# Patient Record
Sex: Female | Born: 1990 | Hispanic: No | Marital: Married | Smoking: Never smoker
Health system: Southern US, Community
[De-identification: ages and names within clinical notes are randomized; demographics above are authoritative.]

## PROBLEM LIST (undated history)

## (undated) DIAGNOSIS — R51 Headache: Secondary | ICD-10-CM

## (undated) DIAGNOSIS — Z8619 Personal history of other infectious and parasitic diseases: Secondary | ICD-10-CM

## (undated) HISTORY — PX: OTHER SURGICAL HISTORY: SHX169

## (undated) HISTORY — DX: Headache: R51

## (undated) HISTORY — DX: Personal history of other infectious and parasitic diseases: Z86.19

---

## 2012-11-11 NOTE — L&D Delivery Note (Signed)
Delivery Note At 5:23 PM a viable and healthy female was delivered via  (Presentation: OA; LOT ).  APGAR: 8,9 ; weight P .   Placenta status: delivered intact, .  Cord:  3 VC with the following complications: nuchal .    Anesthesia: IV pain meds  Episiotomy: none Lacerations: none Suture Repair: N/A Est. Blood Loss (mL): 400cc  Mom to postpartum.  Baby to stay with momm and dad.  BOVARD,Amanda Mueller 07/08/2013, 5:33 PM  Br/Bo/ B+/ RI/Contra?

## 2013-02-25 LAB — OB RESULTS CONSOLE ANTIBODY SCREEN: Antibody Screen: NEGATIVE

## 2013-02-25 LAB — OB RESULTS CONSOLE GC/CHLAMYDIA: Gonorrhea: NEGATIVE

## 2013-02-25 LAB — OB RESULTS CONSOLE ABO/RH

## 2013-06-09 LAB — OB RESULTS CONSOLE GBS: GBS: NEGATIVE

## 2013-06-09 LAB — OB RESULTS CONSOLE GC/CHLAMYDIA
Chlamydia: NEGATIVE
Gonorrhea: NEGATIVE

## 2013-07-07 ENCOUNTER — Telehealth (HOSPITAL_COMMUNITY): Payer: Self-pay | Admitting: *Deleted

## 2013-07-07 ENCOUNTER — Encounter (HOSPITAL_COMMUNITY): Payer: Self-pay | Admitting: *Deleted

## 2013-07-07 DIAGNOSIS — O0930 Supervision of pregnancy with insufficient antenatal care, unspecified trimester: Secondary | ICD-10-CM

## 2013-07-07 NOTE — H&P (Signed)
Amanda Mueller is a 22 y.o. female G1P0 at 12+ for iol given post-dates and favorable cervix.  +FM, no LOF, no VB, occ ctx; Pregnancy complicated by late entry to care and HA,  D/w pt IOL, POC and longer time to delivery  Maternal Medical History:  Contractions: Frequency: irregular.    Fetal activity: Perceived fetal activity is normal.    Prenatal complications: no prenatal complications Prenatal Complications - Diabetes: none.    OB History   Grav Para Term Preterm Abortions TAB SAB Ect Mult Living   1             G1 present No abn pap, no STDs  Past Medical History  Diagnosis Date  . Hx of varicella   . ZOXWRUEA(540.9)    Past Surgical History  Procedure Laterality Date  . Abdominal tumor removal      3 mos age   Family History: cervical CA - mgm Social History: denies ETOH, tob, drugs; single Meds PNV All NKDA   Prenatal Transfer Tool  Maternal Diabetes: No Genetic Screening: Normal Maternal Ultrasounds/Referrals: Normal Fetal Ultrasounds or other Referrals:  None Maternal Substance Abuse:  No Significant Maternal Medications:  None Significant Maternal Lab Results:  Lab values include: Group B Strep negative Other Comments:  late entry to care about 20wk  Review of Systems  Constitutional: Negative.   HENT: Negative.   Eyes: Negative.   Respiratory: Negative.   Cardiovascular: Negative.   Gastrointestinal: Negative.   Genitourinary: Negative.   Musculoskeletal: Negative.   Skin: Negative.   Neurological: Negative.   Psychiatric/Behavioral: Negative.       Last menstrual period 09/28/2012. Maternal Exam:  Uterine Assessment: Contraction frequency is irregular.   Abdomen: Fundal height is appropriate for gestation.   Estimated fetal weight is 7-8#.   Fetal presentation: vertex  Introitus: Normal vulva. Normal vagina.  Pelvis: adequate for delivery.   Cervix: Cervix evaluated by digital exam.     Physical Exam  Constitutional: She is  oriented to person, place, and time. She appears well-developed and well-nourished.  HENT:  Head: Normocephalic and atraumatic.  Cardiovascular: Normal rate and regular rhythm.   Respiratory: Effort normal and breath sounds normal. No respiratory distress.  GI: Soft. Bowel sounds are normal. There is no tenderness.  Musculoskeletal: Normal range of motion.  Neurological: She is alert and oriented to person, place, and time.  Skin: Skin is warm and dry.  Psychiatric: She has a normal mood and affect. Her behavior is normal.    Prenatal labs: ABO, Rh: B/Positive/-- (04/17 0000) Antibody: Negative (04/17 0000) Rubella: Immune (04/17 0000) RPR: Nonreactive (04/17 0000)  HBsAg: Negative (04/17 0000)  HIV: Non-reactive (04/17 0000)  GBS: Negative (07/30 0000)   Tdap 6/4 Hgb 12.5/Plt 255K/ GC neg/ Chl neg/ Ur Cx + E coli/AFP WNL  [place] cwd - 18wk, EDC 07/05/13 - limited anat 23 wk completes anatomy, post plac, female  Assessment/Plan: 22yo G1P0 at 79+ for IOL given term status and favorable cervix AROM and pitocin for IOL Epidural/stadol prn  Expect SVD  Amanda Mueller,Amanda Mueller 07/07/2013, 9:58 PM

## 2013-07-07 NOTE — Telephone Encounter (Signed)
Preadmission screen  

## 2013-07-08 ENCOUNTER — Inpatient Hospital Stay (HOSPITAL_COMMUNITY)
Admission: RE | Admit: 2013-07-08 | Discharge: 2013-07-08 | Disposition: A | Discharge status: Home / Self Care | Payer: Medicaid Other | Source: Ambulatory Visit | Attending: Obstetrics and Gynecology | Admitting: Obstetrics and Gynecology

## 2013-07-08 ENCOUNTER — Inpatient Hospital Stay (HOSPITAL_COMMUNITY)
Admission: AD | Admit: 2013-07-08 | Discharge: 2013-07-10 | DRG: 775 | Disposition: A | Discharge status: Home / Self Care | Payer: Medicaid Other | Source: Ambulatory Visit | Attending: Obstetrics and Gynecology | Admitting: Obstetrics and Gynecology

## 2013-07-08 ENCOUNTER — Encounter (HOSPITAL_COMMUNITY): Payer: Self-pay | Admitting: *Deleted

## 2013-07-08 DIAGNOSIS — O0932 Supervision of pregnancy with insufficient antenatal care, second trimester: Secondary | ICD-10-CM

## 2013-07-08 DIAGNOSIS — O0930 Supervision of pregnancy with insufficient antenatal care, unspecified trimester: Secondary | ICD-10-CM

## 2013-07-08 LAB — TYPE AND SCREEN
ABO/RH(D): B POS
Antibody Screen: NEGATIVE

## 2013-07-08 LAB — ABO/RH: ABO/RH(D): B POS

## 2013-07-08 LAB — CBC
HCT: 35.6 % — ABNORMAL LOW (ref 36.0–46.0)
Hemoglobin: 12.3 g/dL (ref 12.0–15.0)
RDW: 13.2 % (ref 11.5–15.5)
WBC: 13.3 10*3/uL — ABNORMAL HIGH (ref 4.0–10.5)

## 2013-07-08 MED ORDER — TETANUS-DIPHTH-ACELL PERTUSSIS 5-2.5-18.5 LF-MCG/0.5 IM SUSP: 0.5000 mL | Freq: Once | INTRAMUSCULAR | Status: DC

## 2013-07-08 MED ORDER — NALOXONE HCL 0.4 MG/ML IJ SOLN
INTRAMUSCULAR | Status: AC
  Filled 2013-07-08: qty 1

## 2013-07-08 MED ORDER — LIDOCAINE HCL (PF) 1 % IJ SOLN
30.0000 mL | INTRAMUSCULAR | Status: DC | PRN
  Filled 2013-07-08: qty 30

## 2013-07-08 MED ORDER — TERBUTALINE SULFATE 1 MG/ML IJ SOLN: 0.2500 mg | Freq: Once | INTRAMUSCULAR | Status: DC | PRN

## 2013-07-08 MED ORDER — OXYTOCIN BOLUS FROM INFUSION: 500.0000 mL | INTRAVENOUS | Status: DC

## 2013-07-08 MED ORDER — ACETAMINOPHEN 325 MG PO TABS: 650.0000 mg | ORAL_TABLET | ORAL | Status: DC | PRN

## 2013-07-08 MED ORDER — OXYTOCIN 40 UNITS IN LACTATED RINGERS INFUSION - SIMPLE MED
62.5000 mL/h | INTRAVENOUS | Status: DC
  Administered 2013-07-08: 999 mL/h via INTRAVENOUS
  Administered 2013-07-08: 62.5 mL/h via INTRAVENOUS

## 2013-07-08 MED ORDER — ZOLPIDEM TARTRATE 5 MG PO TABS: 5.0000 mg | ORAL_TABLET | Freq: Every evening | ORAL | Status: DC | PRN

## 2013-07-08 MED ORDER — LACTATED RINGERS IV SOLN
INTRAVENOUS | Status: DC
  Administered 2013-07-08: 07:00:00 via INTRAVENOUS

## 2013-07-08 MED ORDER — IBUPROFEN 600 MG PO TABS
600.0000 mg | ORAL_TABLET | Freq: Four times a day (QID) | ORAL | Status: DC
  Administered 2013-07-08 – 2013-07-10 (×6): 600 mg via ORAL
  Filled 2013-07-08 (×6): qty 1

## 2013-07-08 MED ORDER — SENNOSIDES-DOCUSATE SODIUM 8.6-50 MG PO TABS
2.0000 | ORAL_TABLET | Freq: Every day | ORAL | Status: DC
  Administered 2013-07-08 – 2013-07-09 (×2): 2 via ORAL

## 2013-07-08 MED ORDER — BENZOCAINE-MENTHOL 20-0.5 % EX AERO: 1.0000 "application " | INHALATION_SPRAY | CUTANEOUS | Status: DC | PRN

## 2013-07-08 MED ORDER — FENTANYL CITRATE 0.05 MG/ML IJ SOLN
INTRAMUSCULAR | Status: AC
  Administered 2013-07-08: 100 ug via INTRAVENOUS
  Filled 2013-07-08: qty 2

## 2013-07-08 MED ORDER — DIBUCAINE 1 % RE OINT: 1.0000 "application " | TOPICAL_OINTMENT | RECTAL | Status: DC | PRN

## 2013-07-08 MED ORDER — ONDANSETRON HCL 4 MG PO TABS: 4.0000 mg | ORAL_TABLET | ORAL | Status: DC | PRN

## 2013-07-08 MED ORDER — DIPHENHYDRAMINE HCL 25 MG PO CAPS: 25.0000 mg | ORAL_CAPSULE | Freq: Four times a day (QID) | ORAL | Status: DC | PRN

## 2013-07-08 MED ORDER — ONDANSETRON HCL 4 MG/2ML IJ SOLN: 4.0000 mg | Freq: Four times a day (QID) | INTRAMUSCULAR | Status: DC | PRN

## 2013-07-08 MED ORDER — PRENATAL MULTIVITAMIN CH
1.0000 | ORAL_TABLET | Freq: Every day | ORAL | Status: DC
  Administered 2013-07-09: 1 via ORAL
  Filled 2013-07-08: qty 1

## 2013-07-08 MED ORDER — LIDOCAINE HCL (PF) 1 % IJ SOLN
INTRAMUSCULAR | Status: AC
  Filled 2013-07-08: qty 30

## 2013-07-08 MED ORDER — OXYTOCIN 40 UNITS IN LACTATED RINGERS INFUSION - SIMPLE MED
1.0000 m[IU]/min | INTRAVENOUS | Status: DC
  Administered 2013-07-08: 2 m[IU]/min via INTRAVENOUS
  Administered 2013-07-08: 4 m[IU]/min via INTRAVENOUS
  Filled 2013-07-08: qty 1000

## 2013-07-08 MED ORDER — LANOLIN HYDROUS EX OINT: TOPICAL_OINTMENT | CUTANEOUS | Status: DC | PRN

## 2013-07-08 MED ORDER — BUTORPHANOL TARTRATE 1 MG/ML IJ SOLN
2.0000 mg | INTRAMUSCULAR | Status: DC | PRN
  Administered 2013-07-08 (×2): 2 mg via INTRAVENOUS
  Filled 2013-07-08 (×3): qty 2

## 2013-07-08 MED ORDER — WITCH HAZEL-GLYCERIN EX PADS: 1.0000 "application " | MEDICATED_PAD | CUTANEOUS | Status: DC | PRN

## 2013-07-08 MED ORDER — OXYCODONE-ACETAMINOPHEN 5-325 MG PO TABS: 1.0000 | ORAL_TABLET | ORAL | Status: DC | PRN

## 2013-07-08 MED ORDER — IBUPROFEN 600 MG PO TABS
600.0000 mg | ORAL_TABLET | Freq: Four times a day (QID) | ORAL | Status: DC
  Administered 2013-07-08: 600 mg via ORAL
  Filled 2013-07-08: qty 1

## 2013-07-08 MED ORDER — SIMETHICONE 80 MG PO CHEW: 80.0000 mg | CHEWABLE_TABLET | ORAL | Status: DC | PRN

## 2013-07-08 MED ORDER — CITRIC ACID-SODIUM CITRATE 334-500 MG/5ML PO SOLN: 30.0000 mL | ORAL | Status: DC | PRN

## 2013-07-08 MED ORDER — LACTATED RINGERS IV SOLN
500.0000 mL | INTRAVENOUS | Status: DC | PRN
  Administered 2013-07-08: 500 mL via INTRAVENOUS

## 2013-07-08 MED ORDER — IBUPROFEN 600 MG PO TABS: 600.0000 mg | ORAL_TABLET | Freq: Four times a day (QID) | ORAL | Status: DC | PRN

## 2013-07-08 MED ORDER — FENTANYL CITRATE 0.05 MG/ML IJ SOLN
100.0000 ug | Freq: Once | INTRAMUSCULAR | Status: AC
  Administered 2013-07-08: 100 ug via INTRAVENOUS

## 2013-07-08 MED ORDER — LACTATED RINGERS IV SOLN: INTRAVENOUS | Status: DC

## 2013-07-08 MED ORDER — ONDANSETRON HCL 4 MG/2ML IJ SOLN: 4.0000 mg | INTRAMUSCULAR | Status: DC | PRN

## 2013-07-08 NOTE — Plan of Care (Signed)
I have review nursing documentation and concur with assessments

## 2013-07-08 NOTE — Progress Notes (Signed)
Patient ID: Amanda Mueller, female   DOB: Sep 01, 1991, 22 y.o.   MRN: [id number]  uncomf with ctx.  Came in over night in early labor  AFVSS gen NAD FHTs 120's category 1 toco q 2 -  SVE 4.5/90/0  Expect SVD Continue current mgmt Epidural vs IV pain meds prn;  IOL with AROM/pitocin prn

## 2013-07-08 NOTE — Progress Notes (Signed)
Patient ID: Amanda Mueller, female   DOB: [birth year], 22 y.o.   MRN: [id number]  Uncomfortable with ctx.  AFVSS gen NAD FHTs  120's mod var, category 1 toco q 4 min, irr  SVE 5/90/0 AROM ?forebag for light meconium, d/w pt - will monitor  IUPC placed without diff/comp.  22yo G1 at 40+ in labor Augment with pitocin prn Epidural or IV pain meds prn - pt declines epidural Expect SVD

## 2013-07-08 NOTE — MAU Note (Signed)
Contractions,  

## 2013-07-08 NOTE — Progress Notes (Signed)
Patient ID: Amanda Mueller, female   DOB: 07/15/1991, 22 y.o.   MRN: [id number]   Pt uncomfortable with ctx, has had doses of Stadol, declines epidural  AFVSS gen uncomf FHTs 120's min/mod variability toco Q  mVU getting adequate, pit at 4 mU SVE 6.5 per RN  Expect SVD

## 2013-07-08 NOTE — Progress Notes (Signed)
Patient ID: Amanda Mueller, female   DOB: 1991-09-04, 22 y.o.   MRN: [id number]  Pt complete and pushing  130's min/mod variability; some variable/early decels q 2-4 min  Expect SVD

## 2013-07-09 ENCOUNTER — Encounter (HOSPITAL_COMMUNITY): Payer: Self-pay | Admitting: *Deleted

## 2013-07-09 LAB — CBC
MCV: 84.5 fL (ref 78.0–100.0)
Platelets: 198 10*3/uL (ref 150–400)
RBC: 3.54 MIL/uL — ABNORMAL LOW (ref 3.87–5.11)
WBC: 15.6 10*3/uL — ABNORMAL HIGH (ref 4.0–10.5)

## 2013-07-09 NOTE — Discharge Summary (Signed)
Obstetric Discharge Summary Reason for Admission: induction of labor Prenatal Procedures: none Intrapartum Procedures: spontaneous vaginal delivery Postpartum Procedures: none Complications-Operative and Postpartum: none Hemoglobin  Date Value Range Status  07/09/2013 10.1* 12.0 - 15.0 g/dL Final     REPEATED TO VERIFY     DELTA CHECK NOTED     HCT  Date Value Range Status  07/09/2013 29.9* 36.0 - 46.0 % Final    Physical Exam:  General: alert and cooperative Lochia: appropriate Uterine Fundus: firm   Discharge Diagnoses: Term Pregnancy-delivered  Discharge Information: Date: 07/10/2013 Activity: pelvic rest Diet: routine Medications: Ibuprofen Condition: improved Instructions: refer to practice specific booklet Discharge to: home Follow-up Information   Follow up with BOVARD,JODY, MD. Schedule an appointment as soon as possible for a visit in 6 weeks.   Specialty:  Obstetrics and Gynecology   Contact information:   [address] [place] [address] [phone]       Newborn Data: Live born female  Birth Weight: 6 lb 2.2 oz (2785 g) APGAR: 9, 9  Home with mother.  Amanda Mueller 07/10/2013, 7:16 AM

## 2013-07-09 NOTE — Progress Notes (Signed)
Post Partum Day 1 Subjective: no complaints, up ad lib, voiding, tolerating PO and nl lochia, pain controlled  Objective: Blood pressure 104/65, pulse 79, temperature 97.7 F (36.5 C), temperature source Oral, resp. rate 19, height 5\' 3"  (1.6 m), weight 87.091 kg (192 lb), last menstrual period 09/28/2012, SpO2 98.00%, unknown if currently breastfeeding.  Physical Exam:  General: alert and no distress Lochia: appropriate Uterine Fundus: firm   Recent Labs  07/08/13 0455 07/09/13 0615  HGB 12.3 10.1*  HCT 35.6* 29.9*    Assessment/Plan: Plan for discharge tomorrow, Breastfeeding and Lactation consult.  Routine PP care.     LOS: 1 day   BOVARD,Roselynn Whitacre 07/09/2013, 8:32 AM

## 2013-07-10 MED ORDER — IBUPROFEN 600 MG PO TABS: 600.0000 mg | ORAL_TABLET | Freq: Four times a day (QID) | ORAL | Status: DC

## 2013-07-10 NOTE — Progress Notes (Signed)
Post Partum Day 2 Subjective: no complaints, up ad lib and tolerating PO  Objective: Blood pressure 104/65, pulse 79, temperature 97.9 F (36.6 C), temperature source Oral, resp. rate 18, height 5\' 3"  (1.6 m), weight 87.091 kg (192 lb), last menstrual period 09/28/2012, SpO2 98.00%, unknown if currently breastfeeding.  Physical Exam:  General: alert and cooperative Lochia: appropriate Uterine Fundus: firm   Recent Labs  07/08/13 0455 07/09/13 0615  HGB 12.3 10.1*  HCT 35.6* 29.9*    Assessment/Plan: Discharge home Return to office in 6 weeks   LOS: 2 days   Fabyan Loughmiller W 07/10/2013, 7:14 AM

## 2014-09-12 ENCOUNTER — Encounter (HOSPITAL_COMMUNITY): Payer: Self-pay | Admitting: *Deleted

## 2015-11-12 NOTE — L&D Delivery Note (Signed)
Delivery Note At 2:18 PM a viable and healthy female was delivered via Vaginal, Spontaneous Delivery (Presentation: OA;ROT ).  APGAR: 8, 9; weight P  .   Placenta status: delivered, intact .  Cord: 3VC with the following complications: none.    Anesthesia:  Nitrous oxide Episiotomy: None Lacerations: None Suture Repair: none Est. Blood Loss (mL): 200cc  Mom to postpartum.  Baby to Couplet care / Skin to Skin.  Bovard-Stuckert, Amanda Mueller 10/07/2016, 2:36 PM  [facility]  D/W pt circumcision for female infant inc r/b/a - and process, wish to proceed.  Will call to schedule

## 2016-05-23 LAB — OB RESULTS CONSOLE HIV ANTIBODY (ROUTINE TESTING): HIV: NONREACTIVE

## 2016-05-23 LAB — OB RESULTS CONSOLE GC/CHLAMYDIA
Chlamydia: NEGATIVE
GC PROBE AMP, GENITAL: NEGATIVE

## 2016-05-23 LAB — OB RESULTS CONSOLE RPR: RPR: NONREACTIVE

## 2016-06-28 ENCOUNTER — Inpatient Hospital Stay (HOSPITAL_COMMUNITY)
Admission: AD | Admit: 2016-06-28 | Payer: Medicaid Other | Source: Ambulatory Visit | Admitting: Obstetrics and Gynecology

## 2016-09-23 LAB — OB RESULTS CONSOLE GBS
GBS: POSITIVE
STREP GROUP B AG: NEGATIVE

## 2016-10-07 ENCOUNTER — Inpatient Hospital Stay (HOSPITAL_COMMUNITY)
Admission: AD | Admit: 2016-10-07 | Discharge: 2016-10-09 | DRG: 775 | Disposition: A | Discharge status: Home / Self Care | Payer: Medicaid Other | Source: Ambulatory Visit | Attending: Obstetrics and Gynecology | Admitting: Obstetrics and Gynecology

## 2016-10-07 ENCOUNTER — Encounter (HOSPITAL_COMMUNITY): Payer: Self-pay | Admitting: *Deleted

## 2016-10-07 DIAGNOSIS — Z3493 Encounter for supervision of normal pregnancy, unspecified, third trimester: Secondary | ICD-10-CM | POA: Diagnosis present

## 2016-10-07 DIAGNOSIS — O99824 Streptococcus B carrier state complicating childbirth: Secondary | ICD-10-CM | POA: Diagnosis present

## 2016-10-07 DIAGNOSIS — Z3A39 39 weeks gestation of pregnancy: Secondary | ICD-10-CM | POA: Diagnosis not present

## 2016-10-07 LAB — CBC
HCT: 37.3 % (ref 36.0–46.0)
Hemoglobin: 13 g/dL (ref 12.0–15.0)
MCH: 29.4 pg (ref 26.0–34.0)
MCHC: 34.9 g/dL (ref 30.0–36.0)
MCV: 84.4 fL (ref 78.0–100.0)
PLATELETS: 183 10*3/uL (ref 150–400)
RBC: 4.42 MIL/uL (ref 3.87–5.11)
RDW: 13.3 % (ref 11.5–15.5)
WBC: 10.5 10*3/uL (ref 4.0–10.5)

## 2016-10-07 LAB — URINALYSIS, ROUTINE W REFLEX MICROSCOPIC
BILIRUBIN URINE: NEGATIVE
GLUCOSE, UA: NEGATIVE mg/dL
HGB URINE DIPSTICK: NEGATIVE
KETONES UR: NEGATIVE mg/dL
Leukocytes, UA: NEGATIVE
NITRITE: NEGATIVE
PH: 6.5 (ref 5.0–8.0)
Protein, ur: NEGATIVE mg/dL
Specific Gravity, Urine: 1.025 (ref 1.005–1.030)

## 2016-10-07 LAB — POCT FERN TEST

## 2016-10-07 LAB — TYPE AND SCREEN
ABO/RH(D): B POS
Antibody Screen: NEGATIVE

## 2016-10-07 MED ORDER — OXYTOCIN 40 UNITS IN LACTATED RINGERS INFUSION - SIMPLE MED
2.5000 [IU]/h | INTRAVENOUS | Status: DC
  Filled 2016-10-07: qty 1000

## 2016-10-07 MED ORDER — SENNOSIDES-DOCUSATE SODIUM 8.6-50 MG PO TABS
2.0000 | ORAL_TABLET | ORAL | Status: DC
  Administered 2016-10-08 (×2): 2 via ORAL
  Filled 2016-10-07 (×2): qty 2

## 2016-10-07 MED ORDER — OXYTOCIN BOLUS FROM INFUSION
500.0000 mL | Freq: Once | INTRAVENOUS | Status: AC
  Administered 2016-10-07: 500 mL via INTRAVENOUS

## 2016-10-07 MED ORDER — PENICILLIN G POTASSIUM 5000000 UNITS IJ SOLR
5.0000 10*6.[IU] | Freq: Once | INTRAVENOUS | Status: DC
  Filled 2016-10-07: qty 5

## 2016-10-07 MED ORDER — IBUPROFEN 600 MG PO TABS
600.0000 mg | ORAL_TABLET | Freq: Four times a day (QID) | ORAL | Status: DC
  Administered 2016-10-07 – 2016-10-09 (×8): 600 mg via ORAL
  Filled 2016-10-07 (×7): qty 1

## 2016-10-07 MED ORDER — SIMETHICONE 80 MG PO CHEW
80.0000 mg | CHEWABLE_TABLET | ORAL | Status: DC | PRN
Start: 2016-10-07 — End: 2016-10-09

## 2016-10-07 MED ORDER — ONDANSETRON HCL 4 MG/2ML IJ SOLN: 4.0000 mg | Freq: Four times a day (QID) | INTRAMUSCULAR | Status: DC | PRN

## 2016-10-07 MED ORDER — PENICILLIN G POT IN DEXTROSE 60000 UNIT/ML IV SOLN
3.0000 10*6.[IU] | INTRAVENOUS | Status: DC
  Filled 2016-10-07 (×5): qty 50

## 2016-10-07 MED ORDER — LACTATED RINGERS IV SOLN: 500.0000 mL | INTRAVENOUS | Status: DC | PRN

## 2016-10-07 MED ORDER — SODIUM CHLORIDE 0.9 % IV SOLN
2.0000 g | Freq: Once | INTRAVENOUS | Status: AC
  Administered 2016-10-07: 2 g via INTRAVENOUS
  Filled 2016-10-07: qty 2000

## 2016-10-07 MED ORDER — COCONUT OIL OIL: 1.0000 "application " | TOPICAL_OIL | Status: DC | PRN

## 2016-10-07 MED ORDER — OXYCODONE HCL 5 MG PO TABS: 10.0000 mg | ORAL_TABLET | ORAL | Status: DC | PRN

## 2016-10-07 MED ORDER — ONDANSETRON HCL 4 MG PO TABS: 4.0000 mg | ORAL_TABLET | ORAL | Status: DC | PRN

## 2016-10-07 MED ORDER — WITCH HAZEL-GLYCERIN EX PADS: 1.0000 "application " | MEDICATED_PAD | CUTANEOUS | Status: DC | PRN

## 2016-10-07 MED ORDER — ACETAMINOPHEN 325 MG PO TABS: 650.0000 mg | ORAL_TABLET | ORAL | Status: DC | PRN

## 2016-10-07 MED ORDER — FLEET ENEMA 7-19 GM/118ML RE ENEM: 1.0000 | ENEMA | RECTAL | Status: DC | PRN

## 2016-10-07 MED ORDER — OXYCODONE HCL 5 MG PO TABS: 5.0000 mg | ORAL_TABLET | ORAL | Status: DC | PRN

## 2016-10-07 MED ORDER — ONDANSETRON HCL 4 MG/2ML IJ SOLN: 4.0000 mg | INTRAMUSCULAR | Status: DC | PRN

## 2016-10-07 MED ORDER — LACTATED RINGERS IV SOLN: INTRAVENOUS | Status: DC

## 2016-10-07 MED ORDER — OXYCODONE-ACETAMINOPHEN 5-325 MG PO TABS: 1.0000 | ORAL_TABLET | ORAL | Status: DC | PRN

## 2016-10-07 MED ORDER — PRENATAL MULTIVITAMIN CH
1.0000 | ORAL_TABLET | Freq: Every day | ORAL | Status: DC
  Administered 2016-10-08 – 2016-10-09 (×2): 1 via ORAL
  Filled 2016-10-07: qty 1

## 2016-10-07 MED ORDER — DIBUCAINE 1 % RE OINT: 1.0000 "application " | TOPICAL_OINTMENT | RECTAL | Status: DC | PRN

## 2016-10-07 MED ORDER — SOD CITRATE-CITRIC ACID 500-334 MG/5ML PO SOLN: 30.0000 mL | ORAL | Status: DC | PRN

## 2016-10-07 MED ORDER — DIPHENHYDRAMINE HCL 25 MG PO CAPS: 25.0000 mg | ORAL_CAPSULE | Freq: Four times a day (QID) | ORAL | Status: DC | PRN

## 2016-10-07 MED ORDER — ZOLPIDEM TARTRATE 5 MG PO TABS: 5.0000 mg | ORAL_TABLET | Freq: Every evening | ORAL | Status: DC | PRN

## 2016-10-07 MED ORDER — BENZOCAINE-MENTHOL 20-0.5 % EX AERO: 1.0000 "application " | INHALATION_SPRAY | CUTANEOUS | Status: DC | PRN

## 2016-10-07 MED ORDER — LIDOCAINE HCL (PF) 1 % IJ SOLN
30.0000 mL | INTRAMUSCULAR | Status: DC | PRN
  Filled 2016-10-07: qty 30

## 2016-10-07 MED ORDER — OXYCODONE-ACETAMINOPHEN 5-325 MG PO TABS: 2.0000 | ORAL_TABLET | ORAL | Status: DC | PRN

## 2016-10-07 NOTE — H&P (Signed)
Amanda DuhamelMadeleine Mueller is a 25 y.o. female G2P1001 at 39+ presents with ctx, and cervical change.  Late presentation for prenatal care, around 20 wk.  Normal AFP.  Failed glucola but normal 3 hr GTT. Otherwise uncomplicated prenatal care, + GBBS.   OB History    Gravida Para Term Preterm AB Living   2 1 1     1    SAB TAB Ectopic Multiple Live Births           1    no abn pap No STD  G1 40wk SVD 6#2, female 2014 G2 present   Past Medical History:  Diagnosis Date  . Headache(784.0)   . Hx of varicella    Past Surgical History:  Procedure Laterality Date  . abdominal tumor removal     3 mos age   Family History: family history is not on file. Social History:  reports that she has never smoked. She has never used smokeless tobacco. She reports that she does not drink alcohol or use drugs. meds PNV All NKDA    Maternal Diabetes: No Genetic Screening: Normal Maternal Ultrasounds/Referrals: Normal Fetal Ultrasounds or other Referrals:  None Maternal Substance Abuse:  No Significant Maternal Medications:  None Significant Maternal Lab Results:  Lab values include: Group B Strep positive Other Comments:  None  Review of Systems  Constitutional: Negative.   HENT: Negative.   Eyes: Negative.   Respiratory: Negative.   Cardiovascular: Negative.   Gastrointestinal: Negative.   Genitourinary: Negative.   Musculoskeletal: Positive for back pain.  Skin: Negative.   Neurological: Negative.   Psychiatric/Behavioral: Negative.    Maternal Medical History:  Reason for admission: Contractions.   Contractions: Onset was 6-12 hours ago.   Frequency: regular.   Perceived severity is strong.    Fetal activity: Perceived fetal activity is normal.    Prenatal Complications - Diabetes: none.    Dilation: 6 Effacement (%): 90 Station: 0 Exam by:: Dr Ellyn HackBovard Blood pressure 129/77, pulse 87, temperature 98.1 F (36.7 C), temperature source Oral, resp. rate 18, height 5\' 3"  (1.6  m), weight 92.1 kg (203 lb), last menstrual period 01/05/2016, unknown if currently breastfeeding. Maternal Exam:  Uterine Assessment: Contraction strength is moderate.  Contraction frequency is regular.   Abdomen: Patient reports generalized tenderness.  Fundal height is appropriate for gestation.   Estimated fetal weight is 7-7.5#.   Fetal presentation: vertex  Introitus: Normal vulva. Normal vagina.  Pelvis: adequate for delivery.      Physical Exam  Constitutional: She is oriented to person, place, and time. She appears well-developed and well-nourished.  HENT:  Head: Normocephalic and atraumatic.  Cardiovascular: Normal rate and regular rhythm.   Respiratory: Effort normal and breath sounds normal. No respiratory distress. She has no wheezes.  GI: Soft. Bowel sounds are normal. She exhibits no distension. There is generalized tenderness.  Musculoskeletal: Normal range of motion.  Neurological: She is alert and oriented to person, place, and time.  Skin: Skin is warm and dry.  Psychiatric: She has a normal mood and affect. Her behavior is normal.    Prenatal labs: ABO, Rh: --/--/B POS (11/27 1102) Antibody: NEG (11/27 1102) Rubella:  immune RPR: Nonreactive (07/13 0000)  HBsAg:   neg HIV: Non-reactive (07/13 0000)  GBS: Positive (11/13 1316)   Hgb 12.2/Plt 236/Ur Cx neg/GC neg/ Chl neg, Pap WNL/AFP WNL/glucola 148 - nl 3hr GTT, essential panel negative  US nl anat, ant plac, female  Tdap/Flu 07/22/16  Assessment/Plan: 25yo G2P1001 at 39+ in  active labor SROM had after SVE Amp/PCN for GBBS prophylaxis Trying to do w/o anesthesia - epidural and nitrous written for Expect SVD   Bovard-Stuckert, Sarah Zerby 10/07/2016, 1:09 PM

## 2016-10-07 NOTE — MAU Note (Signed)
C/o ucs since 0200 this AM; ?SROM @ 0630:

## 2016-10-07 NOTE — Progress Notes (Signed)
Patient ID: Jetty DuhamelMadeleine Mueller, female   DOB: 07-Apr-1991, 25 y.o.   MRN: [id number]   Uncomfortable with ctx  AFVSS gen NAD FHTs 120-130's, good var, category 1 toco Q 2-5 min  SVE 6/70/0  SROM shortly after SVE - clear fluid

## 2016-10-07 NOTE — Anesthesia Pain Management Evaluation Note (Signed)
  CRNA Pain Management Visit Note  Patient: Amanda DuhamelMadeleine Mueller, 25 y.o., female  "Hello I am a [facility] of the anesthesia team at [facility]. We have an anesthesia team available at all times to provide care throughout the hospital, including epidural management and anesthesia for C-section. I don't know your plan for the delivery whether it a natural birth, water birth, IV sedation, nitrous supplementation, doula or epidural, but we want to meet your pain goals."   1.Was your pain managed to your expectations on prior hospitalizations?   No   2.What is your expectation for pain management during this hospitalization?     Labor support without medications, IV pain meds and Nitrous Oxide  3.How can we help you reach that goal? Pt wishes to go all natural with this baby. She has had previous natural delivery. Pain is already pretty high up on scale. We discussed different options and encouraged her to try something to help with pain relief. Assured her we are available if needed.  Record the patient's initial score and the patient's pain goal.   Pain: 8  Pain Goal: 10 [facility] wants you to be able to say your pain was always managed very well.  Amanda Mueller 10/07/2016

## 2016-10-08 LAB — CBC
HEMATOCRIT: 31.1 % — AB (ref 36.0–46.0)
HEMOGLOBIN: 10.7 g/dL — AB (ref 12.0–15.0)
MCH: 29.4 pg (ref 26.0–34.0)
MCHC: 34.4 g/dL (ref 30.0–36.0)
MCV: 85.4 fL (ref 78.0–100.0)
Platelets: 173 10*3/uL (ref 150–400)
RBC: 3.64 MIL/uL — AB (ref 3.87–5.11)
RDW: 13.7 % (ref 11.5–15.5)
WBC: 9.6 10*3/uL (ref 4.0–10.5)

## 2016-10-08 LAB — RPR: RPR: NONREACTIVE

## 2016-10-08 NOTE — Progress Notes (Signed)
UR chart review completed.  

## 2016-10-08 NOTE — Lactation Note (Deleted)
This note was copied from a baby's chart. Lactation Consultation Note: Infant is now under photo tx. Mother assist with latching infant on the  Right breast. Mothers right nipple is semi flat. She states she has difficulty latching to right. Infant tongue thrust on and off for several mins.  Mother taught hand expression. Infant was fed 10 ml with a spoon. Mother pumped 5 ml and infant took with a spoon.  Mother was sat up with a DEBP. Advised mother to pump after each feeding. Mother was given a curved tip syringe for feeding infant addition EBM. Mother receptive to all teaching.    Patient Name: Amanda Mueller OZHYQ'MToday's Date: 10/08/2016     Maternal Data    Feeding Feeding Type: Breast Fed Length of feed: 10 min  LATCH Score/Interventions Latch: Grasps breast easily, tongue down, lips flanged, rhythmical sucking.  Audible Swallowing: Spontaneous and intermittent  Type of Nipple: Everted at rest and after stimulation  Comfort (Breast/Nipple): Soft / non-tender     Hold (Positioning): Assistance needed to correctly position infant at breast and maintain latch. Intervention(s): Skin to skin;Breastfeeding basics reviewed;Support Pillows;Position options  LATCH Score: 9  Lactation Tools Discussed/Used     Consult Status      Amanda Mueller, Amanda Mueller 10/08/2016, 2:43 PM

## 2016-10-08 NOTE — Progress Notes (Signed)
PPD #1 No problems Afeb, VSS Fundus firm, NT at U-1 Continue routine postpartum care 

## 2016-10-08 NOTE — Lactation Note (Signed)
This note was copied from a baby's chart. Lactation Consultation Note:  Lactation Brochure given with basic teaching done. Mother states that baby has breastfeed 4 times. Mother is unsure if she has enough milk. Mother was taught how to hand express. Observed a few drops of colostrum . Assist mother with practicing hand expression. Discussed that breastfeeding 8-12 times with in 24hours will help milk to come in. Father states that he can hear infant swallow. LC offered to wake infant and assist with latching infant or having mother page LC to assist with latch. Mother states she will page to check next feeding. Mother attempt to give a bottle but infant refused.   Patient Name: Amanda Jetty DuhamelMadeleine Goers ZOXWR'UToday's Mueller: 10/08/2016 Reason for consult: Initial assessment   Maternal Data Has patient been taught Hand Expression?: Yes Does the patient have breastfeeding experience prior to this delivery?: Yes (Simultaneous filing. User may not have seen previous data.)  Feeding Feeding Type: Breast Fed  LATCH Score/Interventions Latch: Too sleepy or reluctant, no latch achieved, no sucking elicited. (attempted; baby too sleepy; encouraged skin to skin)                    Lactation Tools Discussed/Used     Consult Status Consult Status: Follow-up Mueller: 10/08/16 Follow-up type: In-patient    Stevan BornKendrick, Elainah Rhyne [facility] 10/08/2016, 10:52 AM

## 2016-10-09 MED ORDER — IBUPROFEN 600 MG PO TABS: 600.0000 mg | ORAL_TABLET | Freq: Four times a day (QID) | ORAL | 1 refills | Status: DC | PRN

## 2016-10-09 NOTE — Discharge Summary (Signed)
OB Discharge Summary     Patient Name: Amanda DuhamelMadeleine Mueller DOB: 03/10/1991 MRN: [id number]  Date of admission: 10/07/2016 Delivering MD: Sherian ReinBOVARD-STUCKERT, JODY   Date of discharge: 10/09/2016  Admitting diagnosis: LABOR Intrauterine pregnancy: 8940w3d     Secondary diagnosis:  Principal Problem:   SVD (spontaneous vaginal delivery) Active Problems:   Normal labor  Additional problems: none     Discharge diagnosis: Term Pregnancy Delivered                                                                                                Post partum procedures:none  Augmentation: none  Complications: None  Hospital course:  Onset of Labor With Vaginal Delivery     25 y.o. yo [id number] at 6240w3d was admitted in Active Labor on 10/07/2016. Patient had an uncomplicated labor course as follows:  Membrane Rupture Time/Date: 1:01 PM ,10/07/2016   Intrapartum Procedures: Episiotomy: None [1]                                         Lacerations:  None [1]  Patient had a delivery of a Viable infant. 10/07/2016  Information for the patient's newborn:  Shaune LeeksQuinonez, Boy Amanda Mueller [[id number]][[id number]]  Delivery Method: Vag-Spont    Pateint had an uncomplicated postpartum course.  She is ambulating, tolerating a regular diet, passing flatus, and urinating well. Patient is discharged home in stable condition on 10/09/16.    Physical exam Vitals:   10/07/16 2105 10/08/16 0527 10/08/16 1801 10/09/16 0605  BP: 116/64 (!) 96/44 113/66 117/61  Pulse: 93 75 93 (!) 59  Resp: 18 18 18 18   Temp: 98.2 F (36.8 C) 98 F (36.7 C)  98.1 F (36.7 C)  TempSrc: Oral Oral  Oral  SpO2:      Weight:      Height:       General: alert, cooperative and no distress Lochia: appropriate Uterine Fundus: firm Incision: N/A DVT Evaluation: No evidence of DVT seen on physical exam. Labs: Lab Results  Component Value Date   WBC 9.6 10/08/2016   HGB 10.7 (L) 10/08/2016   HCT 31.1 (L) 10/08/2016   MCV 85.4 10/08/2016    PLT 173 10/08/2016   No flowsheet data found.  Discharge instruction: per After Visit Summary and "Baby and Me Booklet".  After visit meds:    Medication List    TAKE these medications   ibuprofen 600 MG tablet Commonly known as:  ADVIL,MOTRIN Take 1 tablet (600 mg total) by mouth every 6 (six) hours as needed.   prenatal multivitamin Tabs tablet Take 1 tablet by mouth daily at 12 noon.       Diet: routine diet  Activity: Advance as tolerated. Pelvic rest for 6 weeks.   Outpatient follow up:6 weeks Follow up Appt:No future appointments. Follow up Visit:No Follow-up on file.  Postpartum contraception: Undecided  Newborn Data: Live born female  Birth Weight: 7 lb 7.4 oz (3385 g) APGAR: 8, 9  Baby Feeding: Bottle and Breast Disposition:home with  mother   10/09/2016 Edwinna Areolaecilia Worema Gaynell Eggleton, DO

## 2016-10-09 NOTE — Lactation Note (Signed)
This note was copied from a baby's chart. Lactation Consultation Note  Patient Name: Amanda Jetty DuhamelMadeleine Mueller ZOXWR'UToday's Date: 10/09/2016 Reason for consult: Follow-up assessment   Follow up with mom of 44 hour old infant. Infant with 3 BF for 10-30 minutes, EBM x 1 of 15 cc, Formula x 8 of 25-40 cc, 4 voids and 4 stools in 24 hours preceding this assessment. LATCH Scores 8. Infant weight 7 lb 5.6 oz with weight loss of 1% since birth.  Mom was latching infant to left breast in the cradle hold with the scissor hold to the breast. Mom reports some pain with latch. Infant was sleepy at the breast. Assisted mom in latching infant to breast in the cross cradle hold and flanging infant lips. Enc mom to use the C hold and to massage/compress breast with feeding. Enc mom to stimulate infant while at breast to maintain active suckling. Mom reports the latch felt better. Mom reports her breasts do not feel any different. Mom reports she does not have enough milk and infant gets frustrated at breast. Enc mom to place infant to breast with each feeding prior to offering bottle. Discussed supply and demand and milk coming to volume.   [facility]. Enc her to call and make an appt. Mom reports she does not have a pump. Manual pump given with instructions for use and cleaning.   Reviewed all BF information in Taking Care of Baby and Me Booklet. Reviewed positioning, BF basics, I/O, Feeding log, Engorgement treatment/prevention, breat milk storage and handling. Infant with f/u ped appt tomorrow. Reviewed LC brochure, mom aware of OP Services, BF Support Groups, and LC phone #. Enc mom to call with questions/concerns prn.    Maternal Data Does the patient have breastfeeding experience prior to this delivery?: Yes  Feeding Feeding Type: Breast Fed Nipple Type: Slow - flow Length of feed: 10 min  LATCH Score/Interventions Latch: Repeated attempts needed to sustain  latch, nipple held in mouth throughout feeding, stimulation needed to elicit sucking reflex. Intervention(s): Skin to skin;Teach feeding cues;Waking techniques Intervention(s): Adjust position;Assist with latch;Breast massage;Breast compression  Audible Swallowing: None Intervention(s): Hand expression;Skin to skin Intervention(s): Alternate breast massage  Type of Nipple: Everted at rest and after stimulation  Comfort (Breast/Nipple): Filling, red/small blisters or bruises, mild/mod discomfort  Problem noted: Mild/Moderate discomfort Interventions (Mild/moderate discomfort):  (flanged lips)  Hold (Positioning): Assistance needed to correctly position infant at breast and maintain latch. Intervention(s): Breastfeeding basics reviewed;Support Pillows;Position options;Skin to skin  LATCH Score: 5  Lactation Tools Discussed/Used WIC Program: No (Plans to apply) Pump Review: Setup, frequency, and cleaning;Milk Storage   Consult Status Consult Status: Complete Follow-up type: Call as needed    Ed BlalockSharon S Jadian Karman 10/09/2016, 11:28 AM

## 2016-10-09 NOTE — Progress Notes (Signed)
Patient ID: Jetty DuhamelMadeleine Schermerhorn, female   DOB: 1991-01-14, 25 y.o.   MRN: [id number] Pt doing well with no complaints. Pain well controlled. Still working on breastfeeding so supplementing a bit. Ready for discharge to home today. VSS ABD- soft, FF Ext- no homans  A/P: PPD#2 -stable         Discharge instructions reviewed         Circ in office ; f/u in 6 weeks for pp visit

## 2016-10-09 NOTE — Discharge Instructions (Signed)
Nothing in vagina for 6 weeks.  No sex, tampons, and douching.  Other instructions as in [facility] Discharge Booklet. °

## 2019-05-20 ENCOUNTER — Other Ambulatory Visit: Payer: Self-pay

## 2019-05-20 ENCOUNTER — Other Ambulatory Visit (HOSPITAL_COMMUNITY)
Admission: RE | Admit: 2019-05-20 | Discharge: 2019-05-20 | Disposition: A | Discharge status: Home / Self Care | Payer: Medicaid Other | Source: Ambulatory Visit | Attending: Family Medicine | Admitting: Family Medicine

## 2019-05-20 ENCOUNTER — Encounter: Payer: Self-pay | Admitting: Family Medicine

## 2019-05-20 ENCOUNTER — Ambulatory Visit (INDEPENDENT_AMBULATORY_CARE_PROVIDER_SITE_OTHER): Payer: Medicaid Other | Admitting: Family Medicine

## 2019-05-20 DIAGNOSIS — Z3A08 8 weeks gestation of pregnancy: Secondary | ICD-10-CM | POA: Diagnosis not present

## 2019-05-20 DIAGNOSIS — Z3480 Encounter for supervision of other normal pregnancy, unspecified trimester: Secondary | ICD-10-CM | POA: Diagnosis not present

## 2019-05-20 DIAGNOSIS — Z3481 Encounter for supervision of other normal pregnancy, first trimester: Secondary | ICD-10-CM | POA: Diagnosis not present

## 2019-05-20 NOTE — Progress Notes (Signed)
DATING AND VIABILITY SONOGRAM   Amanda Mueller is a 28 y.o. year old G59P2002 with LMP Patient's last menstrual period was 03/21/2019 (exact date). which would correlate to  [redacted]w[redacted]d weeks gestation.  She has regular menstrual cycles.   She is here today for a confirmatory initial sonogram.    GESTATION: [place]    FETAL ACTIVITY:          Heart rate         160          The fetus is current.   GESTATIONAL AGE AND  BIOMETRICS:  Gestational criteria: Estimated Date of Delivery: 12/26/19 by LMP now at [redacted]w[redacted]d  Previous Scans:0  GESTATIONAL SAC                   CROWN RUMP LENGTH           2.09 mm        8 weeks 4days                                                   AVERAGE EGA(BY THIS SCAN):  8.4 weeks  WORKING EDD( LMP ):  12/26/2019     TECHNICIAN COMMENTS: Patient informed that the ultrasound is considered a limited obstetric ultrasound and is not intended to be a complete ultrasound exam. Patient also informed that the ultrasound is not being completed with the intent of assessing for fetal or placental anomalies or any pelvic abnormalities. Explained that the purpose of today's ultrasound is to assess for fetal heart rate. Patient acknowledges the purpose of the exam and the limitations of the study.      A copy of this report including all images has been saved and backed up to a second source for retrieval if needed. All measures and details of the anatomical scan, placentation, fluid volume and pelvic anatomy are contained in that report.  Crosby Oyster 05/20/2019 10:45 AM

## 2019-05-20 NOTE — Progress Notes (Signed)
Subjective:  Amanda Mueller is a [id number] [redacted]w[redacted]d being seen today for her first obstetrical visit.  Her obstetrical history is significant for 2 prior pregnancies. Uncomplicated term, vaginal deliveries. FOB involved - husband. . Patient does intend to breast feed. Pregnancy history fully reviewed.  Patient reports nausea.  BP 100/67   Pulse 85   Wt 196 lb (88.9 kg)   LMP 03/21/2019 (Exact Date)   BMI 34.72 kg/m   HISTORY: OB History  Gravida Para Term Preterm AB Living  3 2 2     2   SAB TAB Ectopic Multiple Live Births        0 2    # Outcome Date GA Lbr Len/2nd Weight Sex Delivery Anes PTL Lv  3 Current           2 Term 10/07/16 [redacted]w[redacted]d 07:44 / 00:04 7 lb 7.4 oz (3.385 kg) M Vag-Spont None  LIV  1 Term 07/08/13 [redacted]w[redacted]d 14:16 / 01:07 6 lb 2.2 oz (2.785 kg) F Vag-Spont None  LIV    Past Medical History:  Diagnosis Date  . Headache(784.0)   . Hx of varicella   . SVD (spontaneous vaginal delivery) 10/07/2016    Past Surgical History:  Procedure Laterality Date  . 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 Alcohol abuse Neg Hx   . Arthritis Neg Hx   . Asthma Neg Hx   . Birth defects Neg Hx   . Cancer Neg Hx   . COPD Neg Hx   . Drug abuse Neg Hx   . Diabetes Neg Hx   . Depression Neg Hx   . Early death Neg Hx   . Hearing loss Neg Hx   . Heart disease Neg Hx   . Hyperlipidemia Neg Hx   . Hypertension Neg Hx   . Kidney disease Neg Hx   . Learning disabilities Neg Hx   . Mental illness Neg Hx   . Mental retardation Neg Hx   . Miscarriages / Stillbirths Neg Hx   . Stroke Neg Hx   . Vision loss Neg Hx   . Varicose Veins Neg Hx      Exam  BP 100/67   Pulse 85   Wt 196 lb (88.9 kg)   LMP 03/21/2019 (Exact Date)   BMI 34.72 kg/m   CONSTITUTIONAL: Well-developed, well-nourished female in no acute distress.  HENT:  Normocephalic, atraumatic, External right and left ear normal. Oropharynx is clear and  moist EYES: Conjunctivae and EOM are normal. Pupils are equal, round, and reactive to light. No scleral icterus.  NECK: Normal range of motion, supple, no masses.  Normal thyroid.  CARDIOVASCULAR: Normal heart rate noted, regular rhythm RESPIRATORY: Clear to auscultation bilaterally. Effort and breath sounds normal, no problems with respiration noted. BREASTS: Symmetric in size. No masses, skin changes, nipple drainage, or lymphadenopathy. ABDOMEN: Soft, normal bowel sounds, no distention noted.  No tenderness, rebound or guarding.  PELVIC: Normal appearing external genitalia; normal appearing vaginal mucosa and cervix. No abnormal discharge noted. Normal uterine size, no other palpable masses, no uterine or adnexal tenderness. MUSCULOSKELETAL: Normal range of motion. No tenderness.  No cyanosis, clubbing, or edema.  2+ distal pulses. SKIN: Skin is warm and dry. No rash noted. Not diaphoretic. No erythema. No pallor. NEUROLOGIC: Alert and oriented to person, place, and time. Normal reflexes, muscle tone coordination. No cranial nerve deficit noted. PSYCHIATRIC: Normal mood and affect.  Normal behavior. Normal judgment and thought content.    Assessment:    Pregnancy: [id number] Patient Active Problem List   Diagnosis Date Noted  . Supervision of other normal pregnancy, antepartum 05/20/2019  . Normal labor 10/07/2016  . SVD (spontaneous vaginal delivery) 10/07/2016  . Late prenatal care complicating pregnancy 07/07/2013      Plan:   1. Supervision of other normal pregnancy, antepartum FHT and FH normal PAP done today. DIscussed use of midwives, fellows, call schedule, etc. Panorama next visit. - CHL AMB BABYSCRIPTS SCHEDULE OPTIMIZATION - Culture, OB Urine - Cytology - PAP( [place]) - Hemoglobinopathy Evaluation - Obstetric Panel, Including HIV     Problem list reviewed and updated. 75% of 30 min visit spent on counseling and coordination of care.     Levie HeritageJacob J  Chana Lindstrom 05/20/2019

## 2019-05-21 LAB — CYTOLOGY - PAP
Chlamydia: NEGATIVE
Diagnosis: NEGATIVE
Neisseria Gonorrhea: NEGATIVE

## 2019-05-23 LAB — CULTURE, OB URINE

## 2019-05-23 LAB — URINE CULTURE, OB REFLEX

## 2019-05-25 ENCOUNTER — Other Ambulatory Visit: Payer: Self-pay

## 2019-05-25 DIAGNOSIS — Z3480 Encounter for supervision of other normal pregnancy, unspecified trimester: Secondary | ICD-10-CM

## 2019-05-27 LAB — OBSTETRIC PANEL, INCLUDING HIV
Antibody Screen: NEGATIVE
Basophils Absolute: 0 10*3/uL (ref 0.0–0.2)
Basos: 0 %
EOS (ABSOLUTE): 0.1 10*3/uL (ref 0.0–0.4)
Eos: 1 %
HIV Screen 4th Generation wRfx: NONREACTIVE
Hematocrit: 34.8 % (ref 34.0–46.6)
Hemoglobin: 12.2 g/dL (ref 11.1–15.9)
Hepatitis B Surface Ag: NEGATIVE
Immature Grans (Abs): 0 10*3/uL (ref 0.0–0.1)
Immature Granulocytes: 0 %
Lymphocytes Absolute: 1.3 10*3/uL (ref 0.7–3.1)
Lymphs: 19 %
MCH: 29 pg (ref 26.6–33.0)
MCHC: 35.1 g/dL (ref 31.5–35.7)
MCV: 83 fL (ref 79–97)
Monocytes Absolute: 0.5 10*3/uL (ref 0.1–0.9)
Monocytes: 7 %
Neutrophils Absolute: 5 10*3/uL (ref 1.4–7.0)
Neutrophils: 73 %
Platelets: 218 10*3/uL (ref 150–450)
RBC: 4.2 x10E6/uL (ref 3.77–5.28)
RDW: 13.1 % (ref 11.7–15.4)
RPR Ser Ql: NONREACTIVE
Rh Factor: POSITIVE
Rubella Antibodies, IGG: 3.1 index (ref >0.99)
WBC: 6.9 10*3/uL (ref 3.4–10.8)

## 2019-05-27 LAB — HEMOGLOBINOPATHY EVALUATION
Ferritin: 25 ng/mL (ref 15–150)
Hgb A2 Quant: 2.4 % (ref 1.8–3.2)
Hgb A: 97.6 % (ref 96.4–98.8)
Hgb C: 0 %
Hgb F Quant: 0 % (ref 0.0–2.0)
Hgb S: 0 %
Hgb Solubility: NEGATIVE
Hgb Variant: 0 %

## 2019-06-22 ENCOUNTER — Other Ambulatory Visit: Payer: Self-pay

## 2019-06-22 ENCOUNTER — Ambulatory Visit (INDEPENDENT_AMBULATORY_CARE_PROVIDER_SITE_OTHER): Payer: Medicaid Other | Admitting: Advanced Practice Midwife

## 2019-06-22 ENCOUNTER — Encounter: Payer: Self-pay | Admitting: Advanced Practice Midwife

## 2019-06-22 VITALS — BP 100/57 | HR 94 | Wt 195.1 lb

## 2019-06-22 DIAGNOSIS — O219 Vomiting of pregnancy, unspecified: Secondary | ICD-10-CM | POA: Insufficient documentation

## 2019-06-22 DIAGNOSIS — Z3A13 13 weeks gestation of pregnancy: Secondary | ICD-10-CM

## 2019-06-22 DIAGNOSIS — Z3480 Encounter for supervision of other normal pregnancy, unspecified trimester: Secondary | ICD-10-CM

## 2019-06-22 MED ORDER — ONDANSETRON 4 MG PO TBDP: 4.0000 mg | ORAL_TABLET | Freq: Four times a day (QID) | ORAL | 0 refills | Status: DC | PRN

## 2019-06-22 MED ORDER — DOXYLAMINE-PYRIDOXINE 10-10 MG PO TBEC: 2.0000 | DELAYED_RELEASE_TABLET | Freq: Every evening | ORAL | 5 refills | Status: DC | PRN

## 2019-06-22 NOTE — Progress Notes (Deleted)
  Subjective:    Amanda Mueller is being seen today for her first obstetrical visit.  This {is/is not:9024} a planned pregnancy. She is at [redacted]w[redacted]d gestation. Her obstetrical history is significant for {ob risk factors:10154}. Relationship with FOB: {fob:16621}. Patient {does/does not:19097} intend to breast feed. Pregnancy history fully reviewed.  Patient reports {sx:14538}.  Review of Systems:   Review of Systems  Objective:     BP (!) 100/57   Pulse 94   Wt 88.5 kg   LMP 03/21/2019 (Exact Date)   BMI 34.56 kg/m  Physical Exam  Exam    Assessment:    Pregnancy: P5T6144 Patient Active Problem List   Diagnosis Date Noted  . Nausea/vomiting in pregnancy 06/22/2019  . Supervision of other normal pregnancy, antepartum 05/20/2019  . Normal labor 10/07/2016  . SVD (spontaneous vaginal delivery) 10/07/2016  . Late prenatal care 07/07/2013       Plan:     Initial labs drawn. Prenatal vitamins. Problem list reviewed and updated. AFP3 discussed: {requests/ordered/declines:14581}. Role of ultrasound in pregnancy discussed; fetal survey: {requests/ordered/declines:14581}. Amniocentesis discussed: {amniocentesis:14582}. Follow up in {numbers 0-4:31231} weeks. ***% of *** min visit spent on counseling and coordination of care.  ***   Hansel Feinstein 06/22/2019

## 2019-06-22 NOTE — Patient Instructions (Signed)

## 2019-06-22 NOTE — Progress Notes (Signed)
   PRENATAL VISIT NOTE  Subjective:  Amanda Mueller is a 28 y.o. G3P2002 at [redacted]w[redacted]d being seen today for ongoing prenatal care.  She is currently monitored for the following issues for this low-risk pregnancy and has Late prenatal care complicating pregnancy; Normal labor; SVD (spontaneous vaginal delivery); and Supervision of other normal pregnancy, antepartum on their problem list.  Patient reports nausea.  Not much better though can keep things down.  Contractions: Not present. Vag. Bleeding: None.  Movement: Absent. Denies leaking of fluid.   The following portions of the patient's history were reviewed and updated as appropriate: allergies, current medications, past family history, past medical history, past social history, past surgical history and problem list.   Objective:   Vitals:   06/22/19 1011  BP: (!) 100/57  Pulse: 94  Weight: 88.5 kg    Fetal Status: Fetal Heart Rate (bpm): 144   Movement: Absent     General:  Alert, oriented and cooperative. Patient is in no acute distress.  Skin: Skin is warm and dry. No rash noted.   Cardiovascular: Normal heart rate noted  Respiratory: Normal respiratory effort, no problems with respiration noted  Abdomen: Soft, gravid, appropriate for gestational age.  Pain/Pressure: Present     Pelvic: Cervical exam deferred        Extremities: Normal range of motion.  Edema: None  Mental Status: Normal mood and affect. Normal behavior. Normal judgment and thought content.   Assessment and Plan:  Pregnancy: L3T3428 at [redacted]w[redacted]d Patient Active Problem List   Diagnosis Date Noted  . Nausea/vomiting in pregnancy 06/22/2019  . Supervision of other normal pregnancy, antepartum 05/20/2019  . Normal labor 10/07/2016  . SVD (spontaneous vaginal delivery) 10/07/2016  . Late prenatal care 07/07/2013   Will try Rx Diclegis for nausea.   WIll give Rx for Zofran if Diclegis doesn't work   Reviewed risks of teratogenicity, limit use as much as possible.    Preterm labor symptoms and general obstetric precautions including but not limited to vaginal bleeding, contractions, leaking of fluid and fetal movement were reviewed in detail with the patient. Please refer to After Visit Summary for other counseling recommendations.    Hansel Feinstein, CNM

## 2019-07-02 ENCOUNTER — Telehealth: Payer: Self-pay

## 2019-07-02 NOTE — Telephone Encounter (Signed)
Patient called requesting Panorama results- patient made aware that there was not enough sample for the blood draw. Patient made aware that we need to redraw her lab and scheduled appt for this. Kathrene Alu RN

## 2019-07-09 ENCOUNTER — Other Ambulatory Visit: Payer: Self-pay

## 2019-07-09 ENCOUNTER — Other Ambulatory Visit: Payer: Medicaid Other

## 2019-07-09 DIAGNOSIS — Z3480 Encounter for supervision of other normal pregnancy, unspecified trimester: Secondary | ICD-10-CM

## 2019-07-09 NOTE — Progress Notes (Signed)
Pt presents to office to have [place] labs redraw. Last sample did not have enough cells.  Amanda Mueller, CMA

## 2019-07-15 ENCOUNTER — Telehealth: Payer: Self-pay

## 2019-07-15 NOTE — Telephone Encounter (Signed)
Pt called the office requesting Panorama results. Pt made aware that her Lucina Mellow is still processing. Understanding was voiced.  Constance Whittle l Shaguana Love, CMA

## 2019-07-21 ENCOUNTER — Encounter: Payer: Medicaid Other | Admitting: Family Medicine

## 2019-07-21 NOTE — Telephone Encounter (Signed)
Left message for patient to call for results. Kathrene Alu RN

## 2019-07-27 ENCOUNTER — Encounter: Payer: Self-pay | Admitting: Advanced Practice Midwife

## 2019-07-27 ENCOUNTER — Ambulatory Visit (INDEPENDENT_AMBULATORY_CARE_PROVIDER_SITE_OTHER): Payer: Medicaid Other | Admitting: Advanced Practice Midwife

## 2019-07-27 ENCOUNTER — Other Ambulatory Visit: Payer: Self-pay

## 2019-07-27 VITALS — BP 112/51 | HR 79 | Wt 198.0 lb

## 2019-07-27 DIAGNOSIS — Z3A18 18 weeks gestation of pregnancy: Secondary | ICD-10-CM

## 2019-07-27 DIAGNOSIS — Z3480 Encounter for supervision of other normal pregnancy, unspecified trimester: Secondary | ICD-10-CM

## 2019-07-27 DIAGNOSIS — Z3482 Encounter for supervision of other normal pregnancy, second trimester: Secondary | ICD-10-CM

## 2019-07-27 NOTE — Progress Notes (Signed)
Patient scheduled for anatomy scan on Monday 08-02-19 at 230p. Kathrene Alu RN

## 2019-07-27 NOTE — Progress Notes (Signed)
error 

## 2019-07-27 NOTE — Progress Notes (Signed)
   PRENATAL VISIT NOTE  Subjective:  Amanda Mueller is a 28 y.o. G3P2002 at [redacted]w[redacted]d being seen today for ongoing prenatal care.  She is currently monitored for the following issues for this low-risk pregnancy and has Late prenatal care; Normal labor; SVD (spontaneous vaginal delivery); Supervision of other normal pregnancy, antepartum; and Nausea/vomiting in pregnancy on their problem list.  Patient reports no complaints.  Contractions: Not present. Vag. Bleeding: None.  Movement: Absent. Denies leaking of fluid.   The following portions of the patient's history were reviewed and updated as appropriate: allergies, current medications, past family history, past medical history, past social history, past surgical history and problem list.   Objective:   Vitals:   07/27/19 1042  BP: (!) 112/51  Pulse: 79  Weight: 89.8 kg    Fetal Status: Fetal Heart Rate (bpm): 135 Fundal Height: 19 cm Movement: Absent     General:  Alert, oriented and cooperative. Patient is in no acute distress.  Skin: Skin is warm and dry. No rash noted.   Cardiovascular: Normal heart rate noted  Respiratory: Normal respiratory effort, no problems with respiration noted  Abdomen: Soft, gravid, appropriate for gestational age.  Pain/Pressure: Absent     Pelvic: Cervical exam deferred        Extremities: Normal range of motion.  Edema: None  Mental Status: Normal mood and affect. Normal behavior. Normal judgment and thought content.   Assessment and Plan:  Pregnancy: G3P2002 at [redacted]w[redacted]d 1. Supervision of other normal pregnancy, antepartum       Reviewed upcoming anatomy US       Having a "Reveal" so I told her to tell the sonographer not to disclose       Panorama Low Risk - [place] MFM OB COMP + 14 WK; Future  Preterm labor symptoms and general obstetric precautions including but not limited to vaginal bleeding, contractions, leaking of fluid and fetal movement were reviewed in detail with the patient. Please refer to  After Visit Summary for other counseling recommendations.   RTO or virtual 4 weeks  Future Appointments  Date Time Provider [place]  08/02/2019  2:30 PM [facility] [place] 1 [facility]  08/24/2019  9:30 AM Seabron Spates, CNM [facility] None    Hansel Feinstein, CNM

## 2019-07-27 NOTE — Patient Instructions (Signed)

## 2019-07-27 NOTE — Addendum Note (Signed)
Addended by: Phill Myron on: 07/27/2019 12:36 PM   Modules accepted: Orders

## 2019-08-02 ENCOUNTER — Other Ambulatory Visit: Payer: Self-pay

## 2019-08-02 ENCOUNTER — Ambulatory Visit (HOSPITAL_COMMUNITY)
Admission: RE | Admit: 2019-08-02 | Discharge: 2019-08-02 | Disposition: A | Discharge status: Home / Self Care | Payer: Medicaid Other | Source: Ambulatory Visit | Attending: Advanced Practice Midwife | Admitting: Advanced Practice Midwife

## 2019-08-02 ENCOUNTER — Other Ambulatory Visit (HOSPITAL_COMMUNITY): Payer: Self-pay | Admitting: *Deleted

## 2019-08-02 DIAGNOSIS — Z3480 Encounter for supervision of other normal pregnancy, unspecified trimester: Secondary | ICD-10-CM | POA: Diagnosis not present

## 2019-08-02 DIAGNOSIS — Z3A19 19 weeks gestation of pregnancy: Secondary | ICD-10-CM | POA: Diagnosis not present

## 2019-08-02 DIAGNOSIS — Z362 Encounter for other antenatal screening follow-up: Secondary | ICD-10-CM

## 2019-08-02 DIAGNOSIS — Z363 Encounter for antenatal screening for malformations: Secondary | ICD-10-CM | POA: Diagnosis not present

## 2019-08-02 IMAGING — US US MFM OB COMP +14 WKS
1 series · 13 of 28 positions shown · non-contrast
Comparison: none

[Series 1: us mfm ob comp +14 wks · 78 acquisitions, 13 frames shown]
[im 3/78]
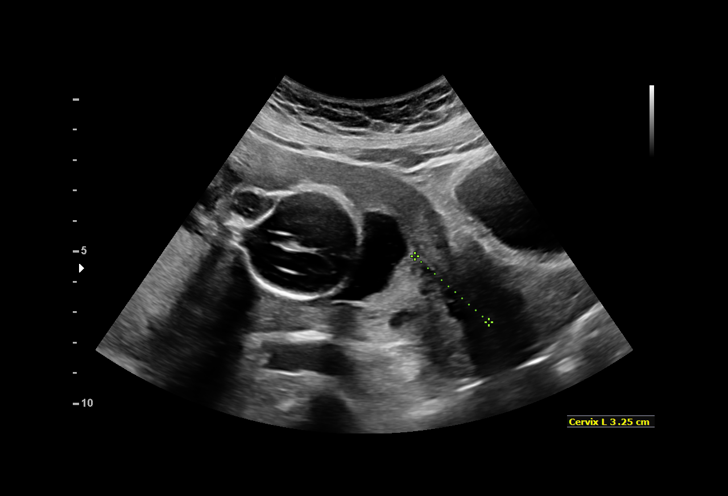
[im 9/78]
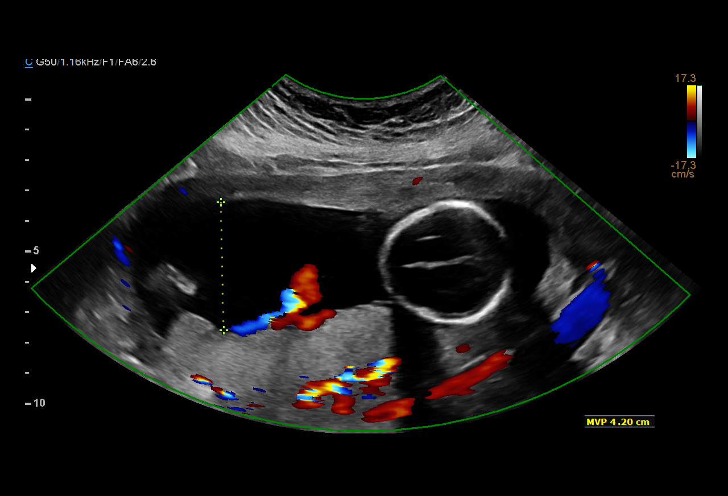
[im 15/78]
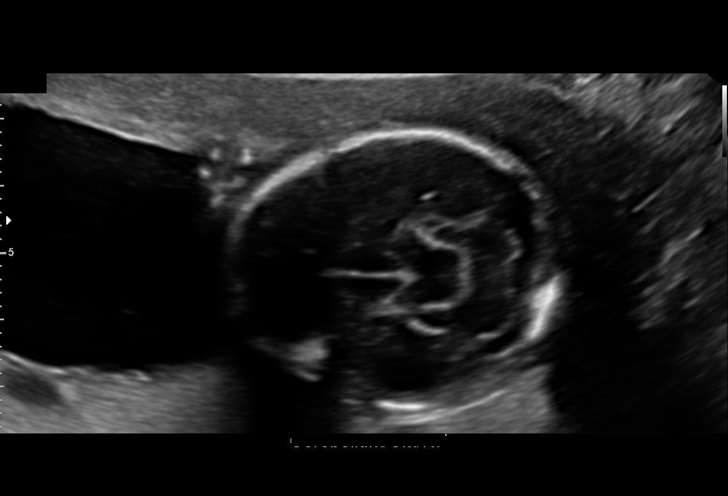
[im 20/78]
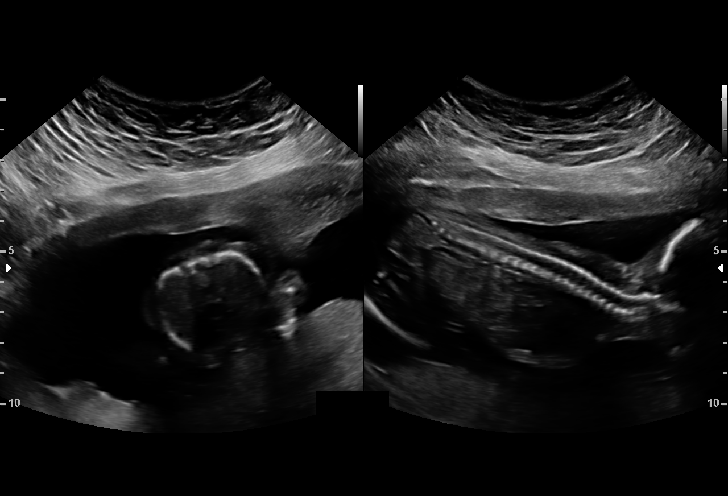
[im 26/78]
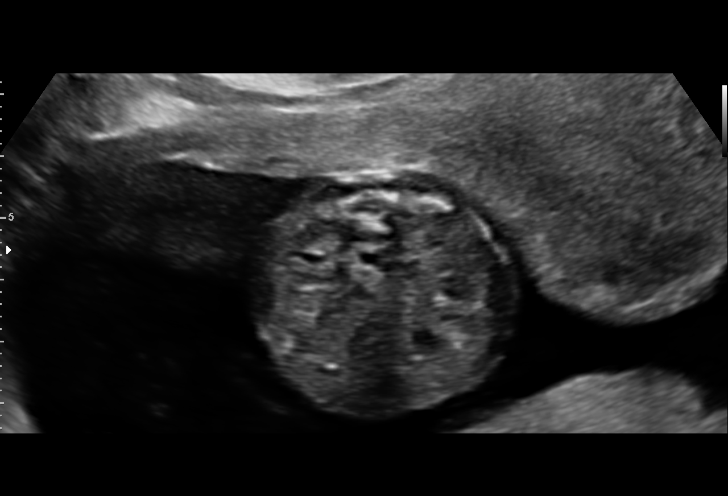
[im 32/78]
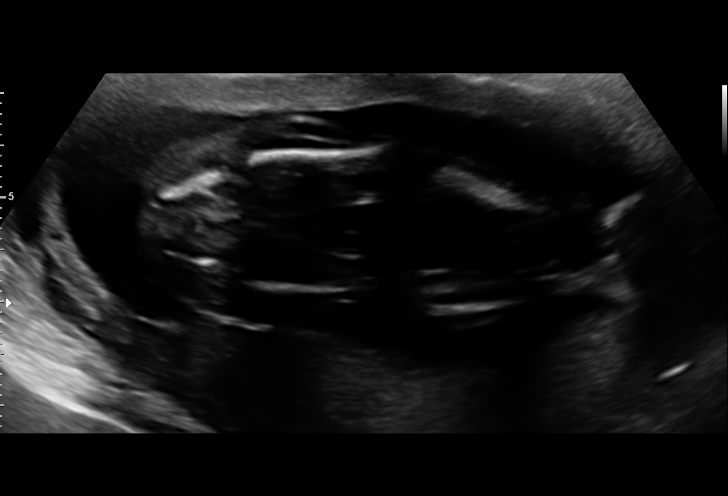
[im 40/78]
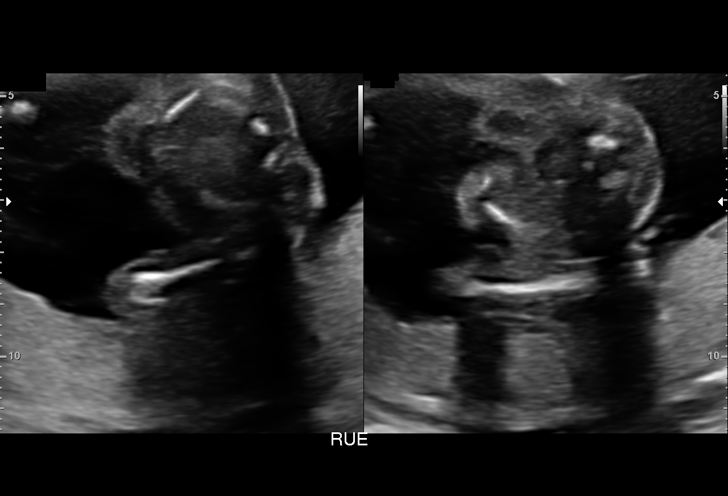
[im 46/78]
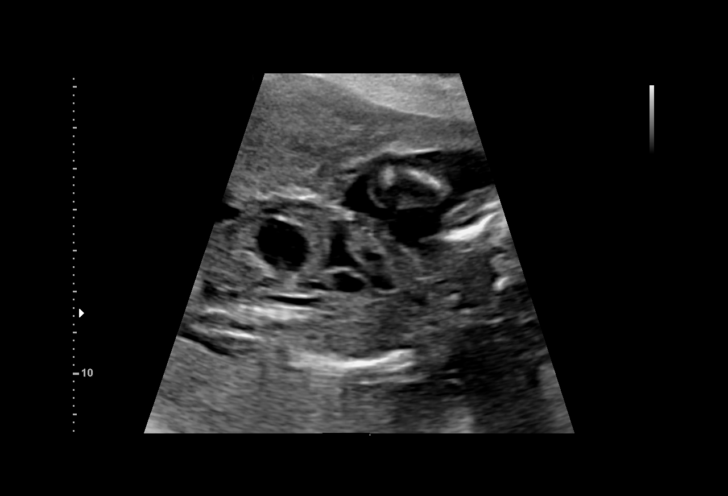
[im 52/78]
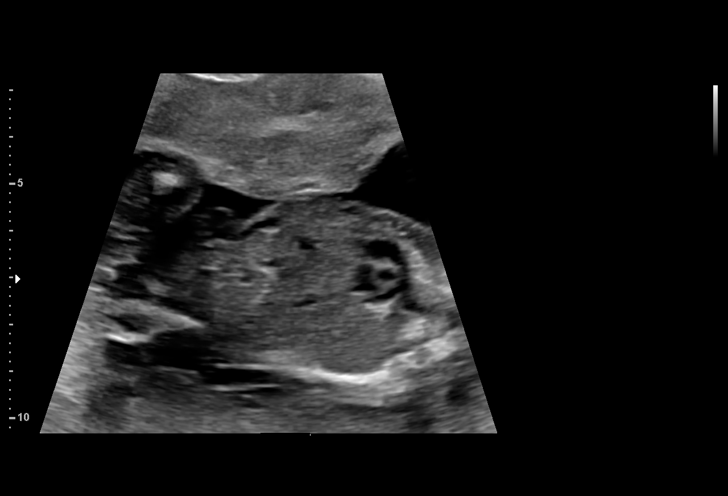
[im 58/78]
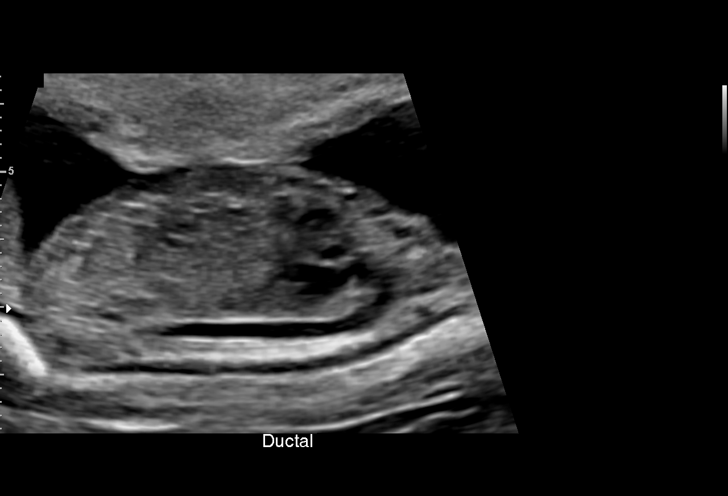
[im 63/78]
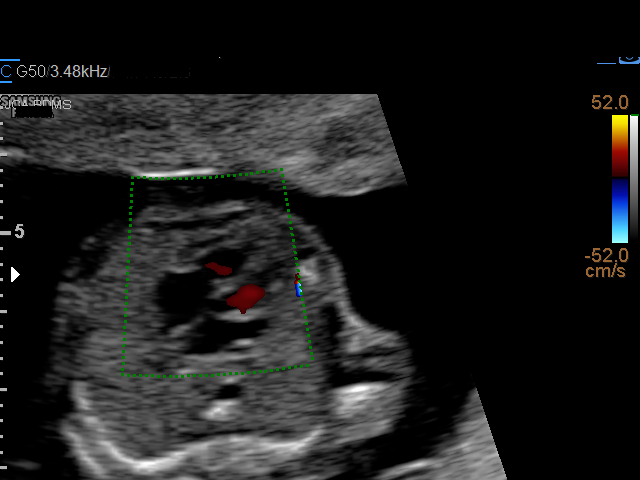
[im 69/78]
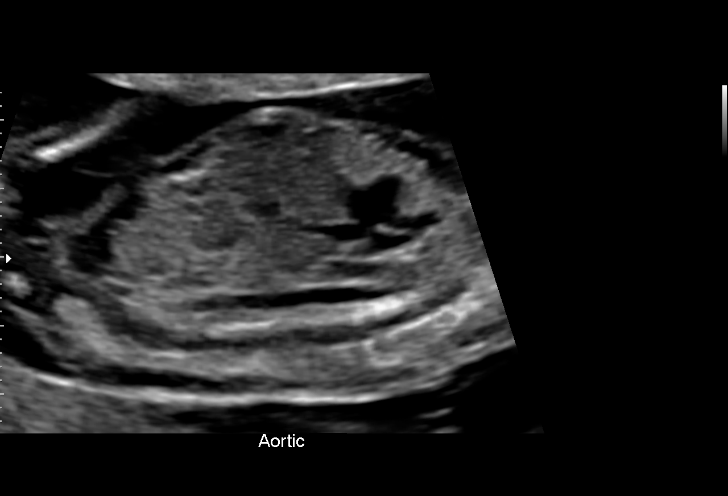
[im 75/78]
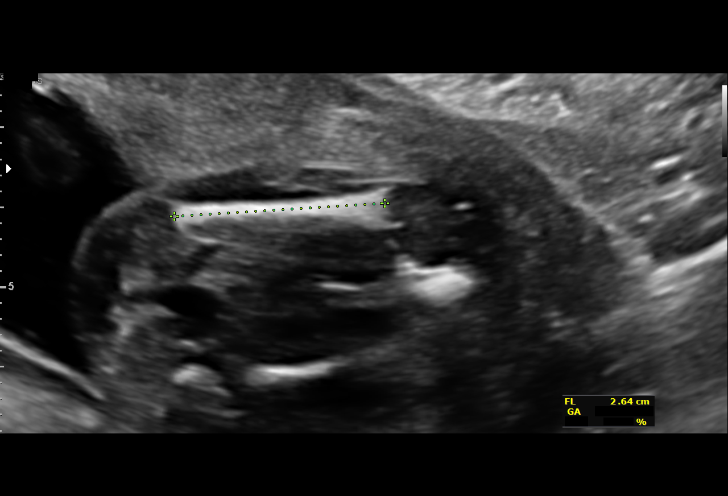

[13 of 28 positions shown; findings below may reference images not displayed]

[REDACTED]
                   GAGLIANO CNM

 ----------------------------------------------------------------------

 ----------------------------------------------------------------------
Indications

  Encounter for antenatal screening for
  malformations (Genetic on 07/09/19)
  19 weeks gestation of pregnancy
 ----------------------------------------------------------------------
Vital Signs

 BMI:
Fetal Evaluation

 Num Of Fetuses:         1
 Fetal Heart Rate(bpm):  138
 Cardiac Activity:       Observed
 Presentation:           Variable
 Placenta:               Posterior
 P. Cord Insertion:      Visualized

 Amniotic Fluid
 AFI FV:      Within normal limits

                             Largest Pocket(cm)

Biometry

 BPD:      40.7  mm     G. Age:  18w 2d         18  %    CI:         78.6   %    70 - 86
                                                         FL/HC:      18.0   %    16.1 -
 HC:      145.2  mm     G. Age:  17w 5d          2  %    HC/AC:      1.07        1.09 -
 AC:      135.4  mm     G. Age:  19w 0d         40  %    FL/BPD:     64.4   %
 FL:       26.2  mm     G. Age:  18w 0d          9  %    FL/AC:      19.4   %    20 - 24
 HUM:      26.8  mm     G. Age:  18w 4d         33  %
 CER:      18.5  mm     G. Age:  18w 2d         24  %
 NFT:       3.4  mm

 LV:        5.3  mm
 CM:        1.7  mm

 Est. FW:     239  gm      0 lb 8 oz     12  %
OB History

 Gravidity:    3         Term:   2
 Living:       2
Gestational Age

 LMP:           19w 1d        Date:  03/21/19                 EDD:   12/26/19
 U/S Today:     18w 2d                                        EDD:   01/01/20
 Best:          19w 1d     Det. By:  LMP  (03/21/19)          EDD:   12/26/19
Anatomy

 Cranium:               Appears normal         Aortic Arch:            Appears normal
 Cavum:                 Appears normal         Ductal Arch:            Appears normal
 Ventricles:            Appears normal         Diaphragm:              Appears normal
 Choroid Plexus:        Appears normal         Stomach:                Appears normal, left
                                                                       sided
 Cerebellum:            Appears normal         Abdomen:                Appears normal
 Posterior Fossa:       Appears normal         Abdominal Wall:         Appears nml (cord
                                                                       insert, abd wall)
 Nuchal Fold:           Appears normal         Cord Vessels:           Appears normal (3
                                                                       vessel cord)
 Face:                  Orbits nl; profile not Kidneys:                Appear normal
                        well visualized
 Lips:                  Appears normal         Bladder:                Appears normal
 Thoracic:              Appears normal         Spine:                  Appears normal
 Heart:                 Appears normal         Upper Extremities:      Appears normal
                        (4CH, axis, and
                        situs)
 RVOT:                  Appears normal         Lower Extremities:      Appears normal
 LVOT:                  Appears normal

 Other:  Female gender Technically difficult due to fetal position.
Cervix Uterus Adnexa

 Cervix
 Length:           3.29  cm.
 Normal appearance by transabdominal scan.
Impression

 Normal interval growth.  No ultrasonic evidence of structural
 fetal anomalies.
 Suboptimal views of the fetal anatomy was obtained
 secondary to fetal position.
 Normal AFI and good fetal movement
Recommendations

 Follow up anatomy in 4 weeks.

## 2019-08-24 ENCOUNTER — Encounter: Payer: Medicaid Other | Admitting: Advanced Practice Midwife

## 2019-09-01 ENCOUNTER — Encounter: Payer: Self-pay | Admitting: Obstetrics & Gynecology

## 2019-09-01 ENCOUNTER — Other Ambulatory Visit: Payer: Self-pay

## 2019-09-01 ENCOUNTER — Ambulatory Visit (INDEPENDENT_AMBULATORY_CARE_PROVIDER_SITE_OTHER): Payer: Medicaid Other | Admitting: Obstetrics & Gynecology

## 2019-09-01 VITALS — BP 107/58 | HR 91 | Wt 199.0 lb

## 2019-09-01 DIAGNOSIS — Z3A23 23 weeks gestation of pregnancy: Secondary | ICD-10-CM

## 2019-09-01 DIAGNOSIS — O0932 Supervision of pregnancy with insufficient antenatal care, second trimester: Secondary | ICD-10-CM

## 2019-09-01 DIAGNOSIS — Z3480 Encounter for supervision of other normal pregnancy, unspecified trimester: Secondary | ICD-10-CM

## 2019-09-01 DIAGNOSIS — O0930 Supervision of pregnancy with insufficient antenatal care, unspecified trimester: Secondary | ICD-10-CM

## 2019-09-01 DIAGNOSIS — O219 Vomiting of pregnancy, unspecified: Secondary | ICD-10-CM

## 2019-09-01 NOTE — Progress Notes (Signed)
Patient refused a flu shot. Kathrene Alu RN

## 2019-09-01 NOTE — Progress Notes (Signed)
   PRENATAL VISIT NOTE  Subjective:  Amanda Mueller is a 28 y.o. G3P2002 at [redacted]w[redacted]d being seen today for ongoing prenatal care.  She is currently monitored for the following issues for this low-risk pregnancy and has Late prenatal care; Supervision of other normal pregnancy, antepartum; and Nausea/vomiting in pregnancy on their problem list.  Patient reports vaginal discharge and  abdominal pressure after working long days. Pt reports that neither occurred in her prior pregnancies. Contractions: Not present. Vag. Bleeding: None.  Movement: Present. Denies leaking of fluid.   The following portions of the patient's history were reviewed and updated as appropriate: allergies, current medications, past family history, past medical history, past social history, past surgical history and problem list.   Objective:   Vitals:   09/01/19 1420  BP: (!) 107/58  Pulse: 91  Weight: 199 lb (90.3 kg)    Fetal Status: Fetal Heart Rate (bpm): 140   Movement: Present     General:  Alert, oriented and cooperative. Patient is in no acute distress.  Skin: Skin is warm and dry. No rash noted.   Cardiovascular: Normal heart rate noted  Respiratory: Normal respiratory effort, no problems with respiration noted  Abdomen: Soft, gravid, appropriate for gestational age.  Pain/Pressure: Absent     Pelvic: Cervical exam deferred        Extremities: Normal range of motion.  Edema: None  Mental Status: Normal mood and affect. Normal behavior. Normal judgment and thought content.   Assessment and Plan:  Pregnancy: G3P2002 at [redacted]w[redacted]d 1. Supervision of other normal pregnancy, antepartum FHR wnl FH ok 3. Late prenatal care 28 week labs and 2 hour GTT on next visit.   Preterm labor symptoms and general obstetric precautions including but not limited to vaginal bleeding, contractions, leaking of fluid and fetal movement were reviewed in detail with the patient. Please refer to After Visit Summary for other  counseling recommendations.   Return in about 4 weeks (around 09/29/2019).  Future Appointments  Date Time Provider [place]  09/01/2019  4:00 PM Lavonia Drafts, MD [facility] None  09/06/2019  3:00 PM [place] [facility]  09/06/2019  3:00 PM [place] 3 [facility]    Lavonia Drafts, MD

## 2019-09-02 ENCOUNTER — Encounter: Payer: Medicaid Other | Admitting: Family Medicine

## 2019-09-06 ENCOUNTER — Ambulatory Visit (HOSPITAL_COMMUNITY): Payer: Medicaid Other

## 2019-09-06 ENCOUNTER — Other Ambulatory Visit: Payer: Self-pay

## 2019-09-06 ENCOUNTER — Ambulatory Visit (INDEPENDENT_AMBULATORY_CARE_PROVIDER_SITE_OTHER): Payer: Medicaid Other

## 2019-09-06 DIAGNOSIS — Z23 Encounter for immunization: Secondary | ICD-10-CM

## 2019-09-06 DIAGNOSIS — Z3480 Encounter for supervision of other normal pregnancy, unspecified trimester: Secondary | ICD-10-CM

## 2019-09-06 NOTE — Progress Notes (Addendum)
Amanda Mueller here for Flu  vaccine. Injection administered without complication. Patient will return in as needed  for next injection.  Amanda Mueller l Amanda Mueller, CMA 09/06/2019  3:18 PM   Attestation of Attending Supervision of RN: Evaluation and management procedures were performed by the nurse under my supervision and collaboration.  I have reviewed the nursing note and chart, and I agree with the management and plan.  Carolyn L. Harraway-Smith, M.D., Cherlynn June

## 2019-09-13 ENCOUNTER — Ambulatory Visit (HOSPITAL_COMMUNITY): Payer: Medicaid Other | Admitting: *Deleted

## 2019-09-13 ENCOUNTER — Ambulatory Visit (HOSPITAL_COMMUNITY)
Admission: RE | Admit: 2019-09-13 | Discharge: 2019-09-13 | Disposition: A | Discharge status: Home / Self Care | Payer: Medicaid Other | Source: Ambulatory Visit | Attending: Maternal & Fetal Medicine | Admitting: Maternal & Fetal Medicine

## 2019-09-13 ENCOUNTER — Other Ambulatory Visit: Payer: Self-pay

## 2019-09-13 ENCOUNTER — Encounter (HOSPITAL_COMMUNITY): Payer: Self-pay | Admitting: *Deleted

## 2019-09-13 DIAGNOSIS — Z3480 Encounter for supervision of other normal pregnancy, unspecified trimester: Secondary | ICD-10-CM | POA: Insufficient documentation

## 2019-09-13 DIAGNOSIS — Z3A25 25 weeks gestation of pregnancy: Secondary | ICD-10-CM

## 2019-09-13 DIAGNOSIS — O99212 Obesity complicating pregnancy, second trimester: Secondary | ICD-10-CM

## 2019-09-13 DIAGNOSIS — Z362 Encounter for other antenatal screening follow-up: Secondary | ICD-10-CM | POA: Insufficient documentation

## 2019-09-13 IMAGING — US US MFM OB FOLLOW-UP
1 series · 14 of 28 positions shown · non-contrast
Comparison: none

[Series 1: us mfm ob follow-up · 41 acquisitions, 14 frames shown]
[im 2/41]
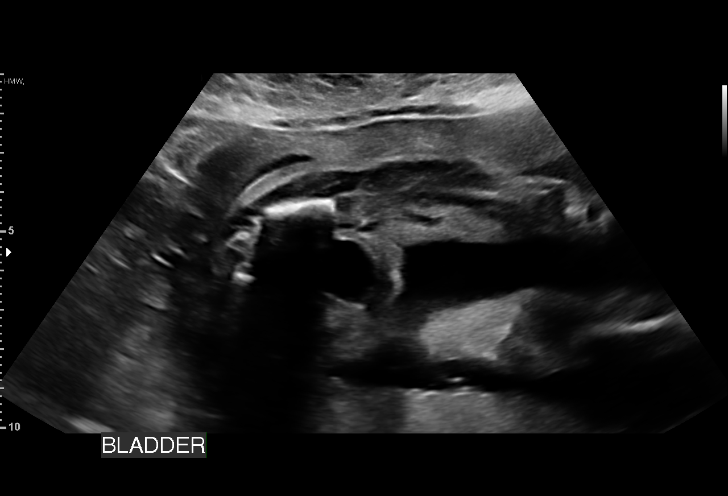
[im 5/41]
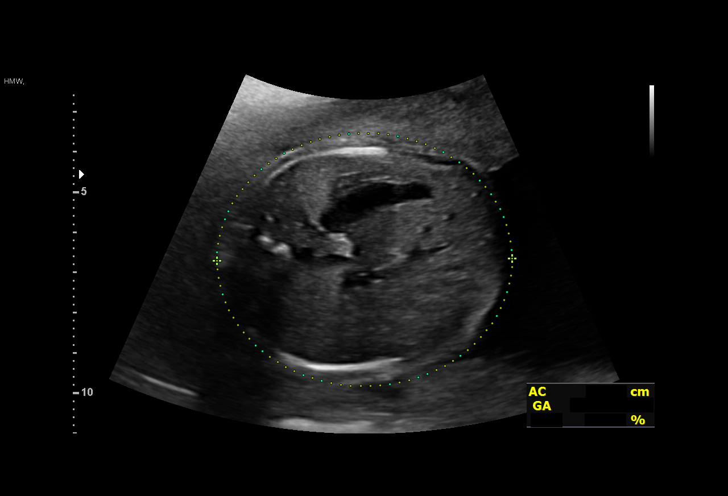
[im 8/41]
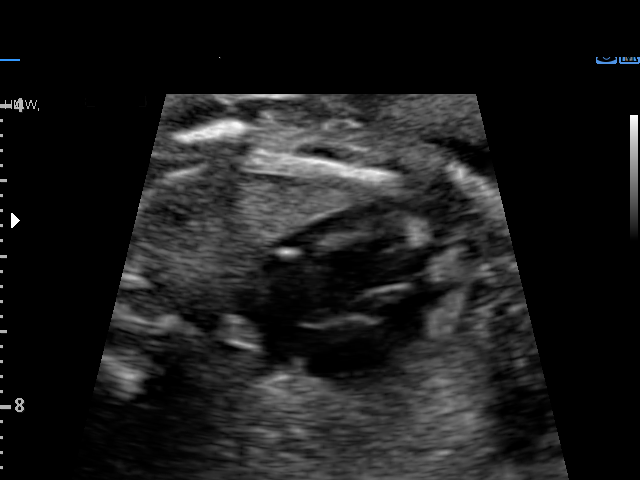
[im 11/41]
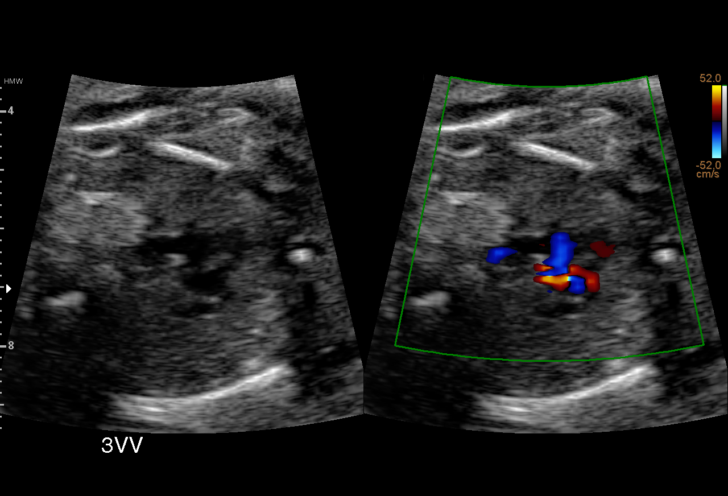
[im 14/41]
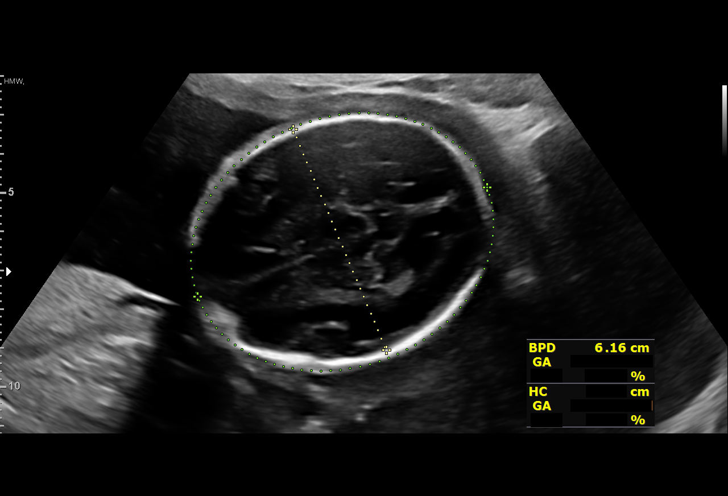
[im 17/41]
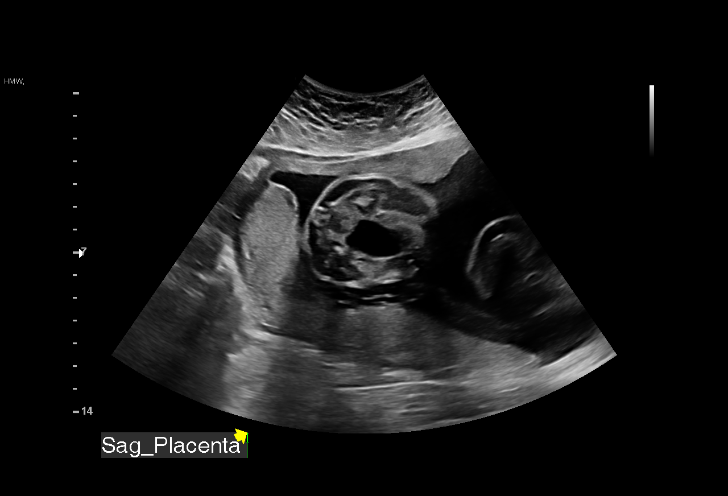
[im 20/41]
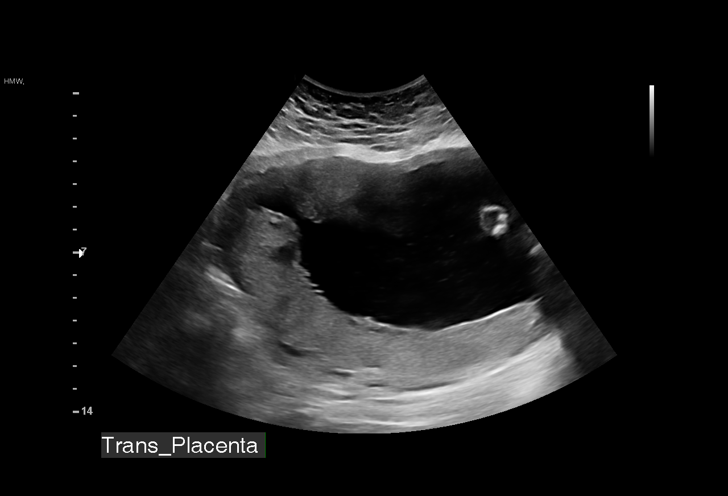
[im 23/41]
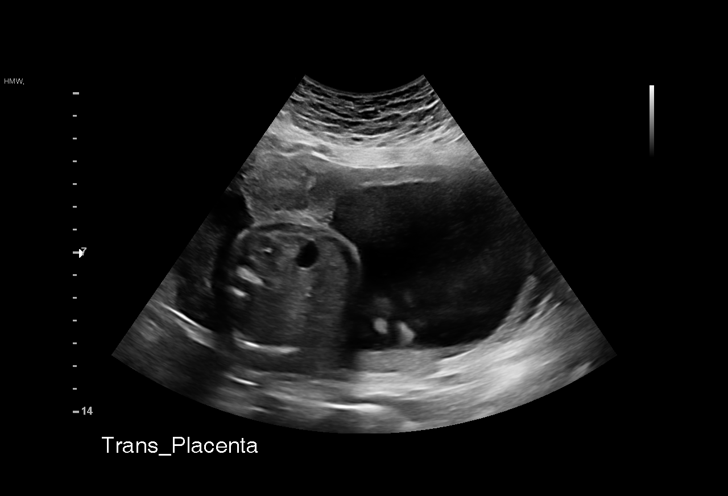
[im 26/41]
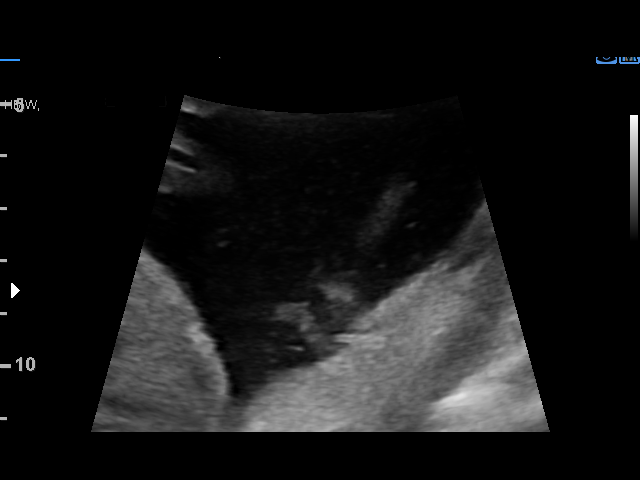
[im 29/41]
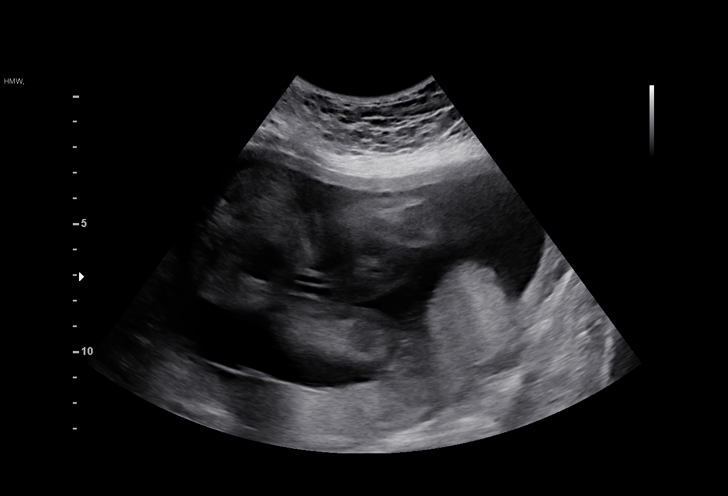
[im 32/41]
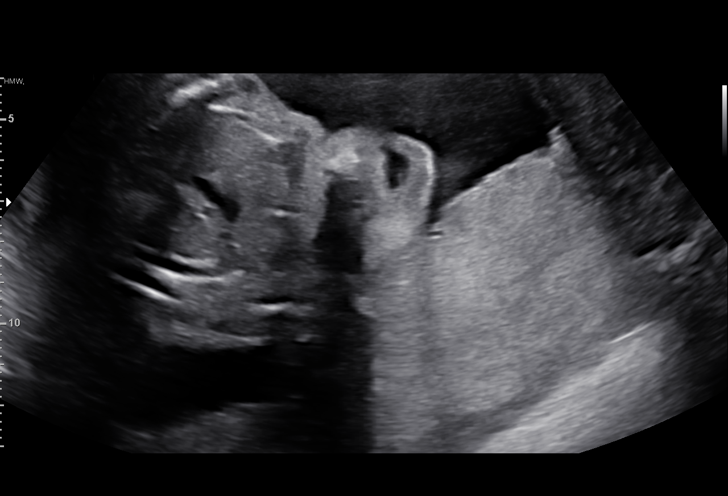
[im 35/41]
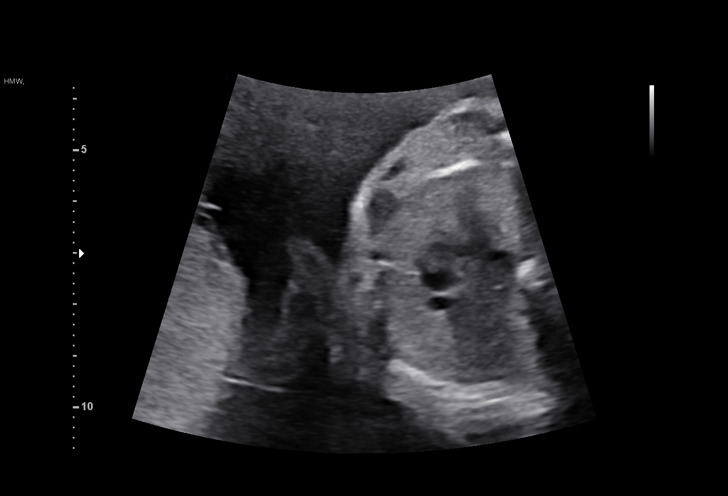
[im 38/41]
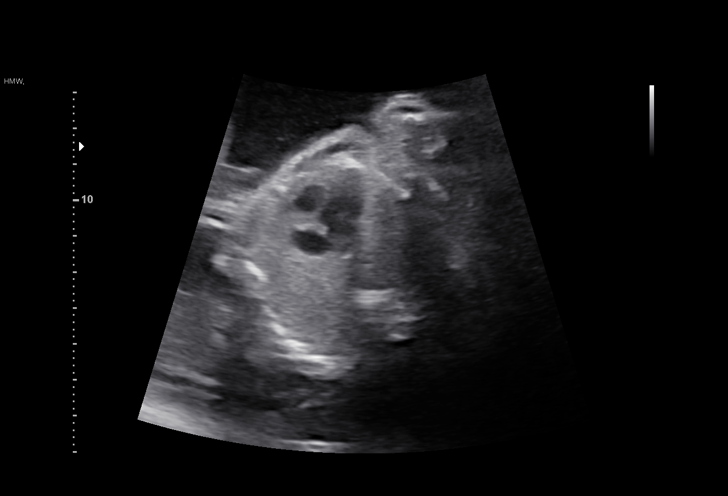
[im 41/41]
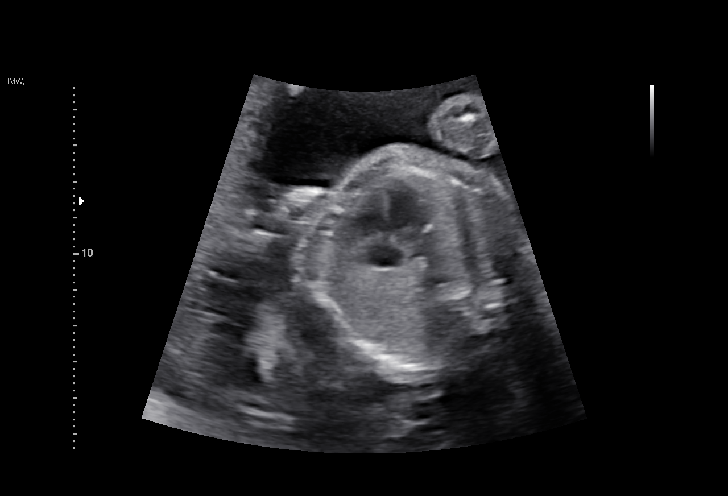

[14 of 28 positions shown; findings below may reference images not displayed]

[REDACTED]
                   FELNER CNM

                                                       FONTI
 ----------------------------------------------------------------------

 ----------------------------------------------------------------------
Indications

  Obesity complicating pregnancy, second
  trimester
  25 weeks gestation of pregnancy
  Encounter for other antenatal screening
  follow-up
 ----------------------------------------------------------------------
Vital Signs

                                                Height:        5'3"
Fetal Evaluation

 Num Of Fetuses:         1
 Cardiac Activity:       Observed
 Presentation:           Cephalic
 Placenta:               Posterior
 P. Cord Insertion:      Previously Visualized

 Amniotic Fluid
 AFI FV:      Within normal limits

                             Largest Pocket(cm)

Biometry

 BPD:      61.4  mm     G. Age:  25w 0d         35  %    CI:        74.49   %    70 - 86
                                                         FL/HC:      19.0   %    18.7 -
 HC:      225.8  mm     G. Age:  24w 4d         13  %    HC/AC:      1.06        1.04 -
 AC:      212.6  mm     G. Age:  25w 5d         61  %    FL/BPD:     70.0   %    71 - 87
 FL:         43  mm     G. Age:  24w 0d         10  %    FL/AC:      20.2   %    20 - 24
 HUM:      38.9  mm     G. Age:  23w 6d         12  %

 Est. FW:     759  gm    1 lb 11 oz      33  %
OB History

 Gravidity:    3         Term:   2
 Living:       2
Gestational Age

 LMP:           25w 1d        Date:  03/21/19                 EDD:   12/26/19
 U/S Today:     24w 6d                                        EDD:   12/28/19
 Best:          25w 1d     Det. By:  LMP  (03/21/19)          EDD:   12/26/19
Anatomy

 Cranium:               Appears normal         Aortic Arch:            Previously seen
 Cavum:                 Previously seen        Ductal Arch:            Previously seen
 Ventricles:            Appears normal         Diaphragm:              Previously seen
 Choroid Plexus:        Previously seen        Stomach:                Appears normal, left
                                                                       sided
 Cerebellum:            Previously seen        Abdomen:                Previously seen
 Posterior Fossa:       Previously seen        Abdominal Wall:         Previously seen
 Nuchal Fold:           Previously seen        Cord Vessels:           Appears normal (3
                                                                       vessel cord)
 Face:                  Not well visualized    Kidneys:                Appear normal
 Lips:                  Appears normal         Bladder:                Appears normal
 Thoracic:              Appears normal         Spine:                  Previously seen
 Heart:                 Appears normal         Upper Extremities:      Previously seen
                        (4CH, axis, and
                        situs)
 RVOT:                  Previously seen        Lower Extremities:      Previously seen
 LVOT:                  Previously seen

 Other:  Female gender Technically difficult due to fetal position.
Cervix Uterus Adnexa

 Cervix
 Not visualized (advanced GA >94wks)

 Uterus
 No abnormality visualized.

 Left Ovary
 No adnexal mass visualized.

 Right Ovary
 No adnexal mass visualized.

 Cul De Sac
 No free fluid seen.

 Adnexa
 No abnormality visualized.
Comments

 This patient was seen for a follow up exam as the views of
 the fetal profile were unable to be visualized during her last
 exam.  She denies any problems since her last exam.
 She was informed that the fetal growth and amniotic fluid
 level appears appropriate for her gestational age.
 The fetal profile was visualized today.
 Follow-up as indicated.

## 2019-10-04 ENCOUNTER — Other Ambulatory Visit: Payer: Self-pay

## 2019-10-04 ENCOUNTER — Other Ambulatory Visit (HOSPITAL_COMMUNITY)
Admission: RE | Admit: 2019-10-04 | Discharge: 2019-10-04 | Disposition: A | Discharge status: Home / Self Care | Payer: Medicaid Other | Source: Ambulatory Visit | Attending: Obstetrics & Gynecology | Admitting: Obstetrics & Gynecology

## 2019-10-04 ENCOUNTER — Ambulatory Visit (INDEPENDENT_AMBULATORY_CARE_PROVIDER_SITE_OTHER): Payer: Medicaid Other | Admitting: Obstetrics & Gynecology

## 2019-10-04 VITALS — BP 95/51 | HR 89 | Wt 203.1 lb

## 2019-10-04 DIAGNOSIS — Z23 Encounter for immunization: Secondary | ICD-10-CM

## 2019-10-04 DIAGNOSIS — Z3480 Encounter for supervision of other normal pregnancy, unspecified trimester: Secondary | ICD-10-CM | POA: Insufficient documentation

## 2019-10-04 DIAGNOSIS — Z3A28 28 weeks gestation of pregnancy: Secondary | ICD-10-CM

## 2019-10-04 DIAGNOSIS — O0930 Supervision of pregnancy with insufficient antenatal care, unspecified trimester: Secondary | ICD-10-CM | POA: Insufficient documentation

## 2019-10-04 DIAGNOSIS — O0933 Supervision of pregnancy with insufficient antenatal care, third trimester: Secondary | ICD-10-CM

## 2019-10-04 NOTE — Patient Instructions (Signed)
Third Trimester of Pregnancy The third trimester is from week 28 through week 40 (months 7 through 9). The third trimester is a time when the unborn baby (fetus) is growing rapidly. At the end of the ninth month, the fetus is about 20 inches in length and weighs 6-10 pounds. Body changes during your third trimester Your body will continue to go through many changes during pregnancy. The changes vary from woman to woman. During the third trimester:  Your weight will continue to increase. You can expect to gain 25-35 pounds (11-16 kg) by the end of the pregnancy.  You may begin to get stretch marks on your hips, abdomen, and breasts.  You may urinate more often because the fetus is moving lower into your pelvis and pressing on your bladder.  You may develop or continue to have heartburn. This is caused by increased hormones that slow down muscles in the digestive tract.  You may develop or continue to have constipation because increased hormones slow digestion and cause the muscles that push waste through your intestines to relax.  You may develop hemorrhoids. These are swollen veins (varicose veins) in the rectum that can itch or be painful.  You may develop swollen, bulging veins (varicose veins) in your legs.  You may have increased body aches in the pelvis, back, or thighs. This is due to weight gain and increased hormones that are relaxing your joints.  You may have changes in your hair. These can include thickening of your hair, rapid growth, and changes in texture. Some women also have hair loss during or after pregnancy, or hair that feels dry or thin. Your hair will most likely return to normal after your baby is born.  Your breasts will continue to grow and they will continue to become tender. A yellow fluid (colostrum) may leak from your breasts. This is the first milk you are producing for your baby.  Your belly button may stick out.  You may notice more swelling in your hands,  face, or ankles.  You may have increased tingling or numbness in your hands, arms, and legs. The skin on your belly may also feel numb.  You may feel short of breath because of your expanding uterus.  You may have more problems sleeping. This can be caused by the size of your belly, increased need to urinate, and an increase in your body's metabolism.  You may notice the fetus "dropping," or moving lower in your abdomen (lightening).  You may have increased vaginal discharge.  You may notice your joints feel loose and you may have pain around your pelvic bone. What to expect at prenatal visits You will have prenatal exams every 2 weeks until week 36. Then you will have weekly prenatal exams. During a routine prenatal visit:  You will be weighed to make sure you and the baby are growing normally.  Your blood pressure will be taken.  Your abdomen will be measured to track your baby's growth.  The fetal heartbeat will be listened to.  Any test results from the previous visit will be discussed.  You may have a cervical check near your due date to see if your cervix has softened or thinned (effaced).  You will be tested for Group B streptococcus. This happens between 35 and 37 weeks. Your health care provider may ask you:  What your birth plan is.  How you are feeling.  If you are feeling the baby move.  If you have had any abnormal   symptoms, such as leaking fluid, bleeding, severe headaches, or abdominal cramping.  If you are using any tobacco products, including cigarettes, chewing tobacco, and electronic cigarettes.  If you have any questions. Other tests or screenings that may be performed during your third trimester include:  Blood tests that check for low iron levels (anemia).  Fetal testing to check the health, activity level, and growth of the fetus. Testing is done if you have certain medical conditions or if there are problems during the pregnancy.  Nonstress test  (NST). This test checks the health of your baby to make sure there are no signs of problems, such as the baby not getting enough oxygen. During this test, a belt is placed around your belly. The baby is made to move, and its heart rate is monitored during movement. What is false labor? False labor is a condition in which you feel small, irregular tightenings of the muscles in the womb (contractions) that usually go away with rest, changing position, or drinking water. These are called Braxton Hicks contractions. Contractions may last for hours, days, or even weeks before true labor sets in. If contractions come at regular intervals, become more frequent, increase in intensity, or become painful, you should see your health care provider. What are the signs of labor?  Abdominal cramps.  Regular contractions that start at 10 minutes apart and become stronger and more frequent with time.  Contractions that start on the top of the uterus and spread down to the lower abdomen and back.  Increased pelvic pressure and dull back pain.  A watery or bloody mucus discharge that comes from the vagina.  Leaking of amniotic fluid. This is also known as your "water breaking." It could be a slow trickle or a gush. Let your health care provider know if it has a color or strange odor. If you have any of these signs, call your health care provider right away, even if it is before your due date. Follow these instructions at home: Medicines  Follow your health care provider's instructions regarding medicine use. Specific medicines may be either safe or unsafe to take during pregnancy.  Take a prenatal vitamin that contains at least 600 micrograms (mcg) of folic acid.  If you develop constipation, try taking a stool softener if your health care provider approves. Eating and drinking   Eat a balanced diet that includes fresh fruits and vegetables, whole grains, good sources of protein such as meat, eggs, or tofu,  and low-fat dairy. Your health care provider will help you determine the amount of weight gain that is right for you.  Avoid raw meat and uncooked cheese. These carry germs that can cause birth defects in the baby.  If you have low calcium intake from food, talk to your health care provider about whether you should take a daily calcium supplement.  Eat four or five small meals rather than three large meals a day.  Limit foods that are high in fat and processed sugars, such as fried and sweet foods.  To prevent constipation: ? Drink enough fluid to keep your urine clear or pale yellow. ? Eat foods that are high in fiber, such as fresh fruits and vegetables, whole grains, and beans. Activity  Exercise only as directed by your health care provider. Most women can continue their usual exercise routine during pregnancy. Try to exercise for 30 minutes at least 5 days a week. Stop exercising if you experience uterine contractions.  Avoid heavy lifting.  Do   not exercise in extreme heat or humidity, or at high altitudes.  Wear low-heel, comfortable shoes.  Practice good posture.  You may continue to have sex unless your health care provider tells you otherwise. Relieving pain and discomfort  Take frequent breaks and rest with your legs elevated if you have leg cramps or low back pain.  Take warm sitz baths to soothe any pain or discomfort caused by hemorrhoids. Use hemorrhoid cream if your health care provider approves.  Wear a good support bra to prevent discomfort from breast tenderness.  If you develop varicose veins: ? Wear support pantyhose or compression stockings as told by your healthcare provider. ? Elevate your feet for 15 minutes, 3-4 times a day. Prenatal care  Write down your questions. Take them to your prenatal visits.  Keep all your prenatal visits as told by your health care provider. This is important. Safety  Wear your seat belt at all times when driving.  Make  a list of emergency phone numbers, including numbers for family, friends, the hospital, and police and fire departments. General instructions  Avoid cat litter boxes and soil used by cats. These carry germs that can cause birth defects in the baby. If you have a cat, ask someone to clean the litter box for you.  Do not travel far distances unless it is absolutely necessary and only with the approval of your health care provider.  Do not use hot tubs, steam rooms, or saunas.  Do not drink alcohol.  Do not use any products that contain nicotine or tobacco, such as cigarettes and e-cigarettes. If you need help quitting, ask your health care provider.  Do not use any medicinal herbs or unprescribed drugs. These chemicals affect the formation and growth of the baby.  Do not douche or use tampons or scented sanitary pads.  Do not cross your legs for long periods of time.  To prepare for the arrival of your baby: ? Take prenatal classes to understand, practice, and ask questions about labor and delivery. ? Make a trial run to the hospital. ? Visit the hospital and tour the maternity area. ? Arrange for maternity or paternity leave through employers. ? Arrange for family and friends to take care of pets while you are in the hospital. ? Purchase a rear-facing car seat and make sure you know how to install it in your car. ? Pack your hospital bag. ? Prepare the baby's nursery. Make sure to remove all pillows and stuffed animals from the baby's crib to prevent suffocation.  Visit your dentist if you have not gone during your pregnancy. Use a soft toothbrush to brush your teeth and be gentle when you floss. Contact a health care provider if:  You are unsure if you are in labor or if your water has broken.  You become dizzy.  You have mild pelvic cramps, pelvic pressure, or nagging pain in your abdominal area.  You have lower back pain.  You have persistent nausea, vomiting, or diarrhea.   You have an unusual or bad smelling vaginal discharge.  You have pain when you urinate. Get help right away if:  Your water breaks before 37 weeks.  You have regular contractions less than 5 minutes apart before 37 weeks.  You have a fever.  You are leaking fluid from your vagina.  You have spotting or bleeding from your vagina.  You have severe abdominal pain or cramping.  You have rapid weight loss or weight gain.  You have   shortness of breath with chest pain.  You notice sudden or extreme swelling of your face, hands, ankles, feet, or legs.  Your baby makes fewer than 10 movements in 2 hours.  You have severe headaches that do not go away when you take medicine.  You have vision changes. Summary  The third trimester is from week 28 through week 40, months 7 through 9. The third trimester is a time when the unborn baby (fetus) is growing rapidly.  During the third trimester, your discomfort may increase as you and your baby continue to gain weight. You may have abdominal, leg, and back pain, sleeping problems, and an increased need to urinate.  During the third trimester your breasts will keep growing and they will continue to become tender. A yellow fluid (colostrum) may leak from your breasts. This is the first milk you are producing for your baby.  False labor is a condition in which you feel small, irregular tightenings of the muscles in the womb (contractions) that eventually go away. These are called Braxton Hicks contractions. Contractions may last for hours, days, or even weeks before true labor sets in.  Signs of labor can include: abdominal cramps; regular contractions that start at 10 minutes apart and become stronger and more frequent with time; watery or bloody mucus discharge that comes from the vagina; increased pelvic pressure and dull back pain; and leaking of amniotic fluid. This information is not intended to replace advice given to you by your health  care provider. Make sure you discuss any questions you have with your health care provider. Document Released: 10/22/2001 Document Revised: 02/18/2019 Document Reviewed: 12/03/2016 Elsevier Patient Education  2020 [employer]  

## 2019-10-04 NOTE — Progress Notes (Signed)
   PRENATAL VISIT NOTE  Subjective:  Amanda Mueller is a 28 y.o. G3P2002 at [redacted]w[redacted]d being seen today for ongoing prenatal care.  She is currently monitored for the following issues for this low-risk pregnancy and has Late prenatal care; Supervision of other normal pregnancy, antepartum; and Nausea/vomiting in pregnancy on their problem list.  Patient reports no complaints. Denies leaking of fluid.   The following portions of the patient's history were reviewed and updated as appropriate: allergies, current medications, past family history, past medical history, past social history, past surgical history and problem list.   Objective:   Vitals:   10/04/19 0853  Weight: 203 lb 1.3 oz (92.1 kg)    Fetal Status:  General:  Alert, oriented and cooperative. Patient is in no acute distress.  Skin: Skin is warm and dry. No rash noted.   Cardiovascular: Normal heart rate noted  Respiratory: Normal respiratory effort, no problems with respiration noted  Abdomen: Soft, gravid, appropriate for gestational age.        Pelvic: Cervical exam deferred        Extremities: Normal range of motion.     Mental Status: Normal mood and affect. Normal behavior. Normal judgment and thought content.   Assessment and Plan:  Pregnancy: G3P2002 at [redacted]w[redacted]d 1. Supervision of other normal pregnancy, antepartum FH and FHR 2 hour GGT and 28 week labs   2. Late prenatal care  Preterm labor symptoms and general obstetric precautions including but not limited to vaginal bleeding, contractions, leaking of fluid and fetal movement were reviewed in detail with the patient. Please refer to After Visit Summary for other counseling recommendations.   Return in about 2 weeks (around 10/18/2019) for Web based.  No future appointments.  Lavonia Drafts, MD

## 2019-10-05 LAB — CBC
Hematocrit: 34.8 % (ref 34.0–46.6)
Hemoglobin: 11.6 g/dL (ref 11.1–15.9)
MCH: 28.9 pg (ref 26.6–33.0)
MCHC: 33.3 g/dL (ref 31.5–35.7)
MCV: 87 fL (ref 79–97)
Platelets: 219 10*3/uL (ref 150–450)
RBC: 4.01 x10E6/uL (ref 3.77–5.28)
RDW: 12.7 % (ref 11.7–15.4)
WBC: 8.7 10*3/uL (ref 3.4–10.8)

## 2019-10-05 LAB — GLUCOSE TOLERANCE, 2 HOURS W/ 1HR
Glucose, 1 hour: 92 mg/dL (ref 65–179)
Glucose, 2 hour: 108 mg/dL (ref 65–152)
Glucose, Fasting: 79 mg/dL (ref 65–91)

## 2019-10-05 LAB — CERVICOVAGINAL ANCILLARY ONLY
Bacterial Vaginitis (gardnerella): POSITIVE — AB
Candida Glabrata: NEGATIVE
Candida Vaginitis: POSITIVE — AB
Comment: NEGATIVE
Comment: NEGATIVE
Comment: NEGATIVE

## 2019-10-05 LAB — HIV ANTIBODY (ROUTINE TESTING W REFLEX): HIV Screen 4th Generation wRfx: NONREACTIVE

## 2019-10-05 LAB — RPR: RPR Ser Ql: NONREACTIVE

## 2019-10-06 ENCOUNTER — Telehealth: Payer: Self-pay

## 2019-10-06 DIAGNOSIS — B9689 Other specified bacterial agents as the cause of diseases classified elsewhere: Secondary | ICD-10-CM

## 2019-10-06 DIAGNOSIS — N760 Acute vaginitis: Secondary | ICD-10-CM

## 2019-10-06 DIAGNOSIS — B379 Candidiasis, unspecified: Secondary | ICD-10-CM

## 2019-10-06 MED ORDER — TERCONAZOLE 0.4 % VA CREA: 1.0000 | TOPICAL_CREAM | Freq: Every day | VAGINAL | 0 refills | Status: DC

## 2019-10-06 MED ORDER — METRONIDAZOLE 500 MG PO TABS: 500.0000 mg | ORAL_TABLET | Freq: Two times a day (BID) | ORAL | 0 refills | Status: DC

## 2019-10-06 NOTE — Telephone Encounter (Signed)
Called pt regarding results. Pt made aware that she has BV and yeast. Pt also aware that she passed her 2 hr GTT. Understanding was voiced. Medication was sent to the pharmacy.  chiquita l wilson, CMA

## 2019-10-19 ENCOUNTER — Other Ambulatory Visit: Payer: Self-pay

## 2019-10-19 ENCOUNTER — Encounter: Payer: Medicaid Other | Admitting: Advanced Practice Midwife

## 2019-11-12 NOTE — L&D Delivery Note (Addendum)
OB/GYN Faculty Practice Delivery Note  Amanda Mueller is a 29 y.o. G3P2002 s/p SVD at [redacted]w[redacted]d. She was admitted for spontaneous onset oflabor.   ROM: SROM 12/27/2019 @ 1208  with clear fluid GBS Status:    Positive - Non-Adequate tx with Ampicillin   Labor Progress: . Patient presented to L&D for spontaneous onset oflabor. Initial SVE: 3/70/-2. Labor course was uncomplicated. She then progressed to complete without augmentation.   Delivery Date/Time: 12/27/2019 @ 1257 Delivery: Called to room and patient was complete and pushing. Head position was ROA/compound presentation with right hand. Delivered with ease over the perineum. No nuchal cord present. Shoulder and body delivered in usual fashion. Infant with spontaneous cry, placed on mother's abdomen, dried and stimulated. Cord clamped x 2 after 1-minute delay, and cut by FOB. Cord blood drawn. Placenta delivered spontaneously with gentle cord traction. Fundus firm with massage and pitocin started. Labia, perineum, vagina, and cervix inspected and significant for no lacerations.  Baby Weight: pending  Cord: central insertion, 3 vessel Placenta: Sent to L&D Complications: None Lacerations: None EBL: 56cc Analgesia: IV Pain Medication (Fentanyl)  Infant: APGAR (1 MIN): 9   APGAR (5 MINS): 9    Leonides Cave, DO, PGY-1 OBGYN Faculty Teaching Service  12/27/2019, 1:16 PM   Midwife attestation: I was gloved and present for delivery in its entirety and I agree with the above resident's note.  Sharyon Cable, CNM 1:24 PM

## 2019-12-04 ENCOUNTER — Encounter (HOSPITAL_COMMUNITY): Payer: Self-pay | Admitting: Family Medicine

## 2019-12-04 ENCOUNTER — Inpatient Hospital Stay (HOSPITAL_COMMUNITY)
Admission: AD | Admit: 2019-12-04 | Discharge: 2019-12-04 | Disposition: A | Discharge status: Home / Self Care | Payer: Medicaid Other | Attending: Family Medicine | Admitting: Family Medicine

## 2019-12-04 ENCOUNTER — Other Ambulatory Visit: Payer: Self-pay

## 2019-12-04 DIAGNOSIS — O98513 Other viral diseases complicating pregnancy, third trimester: Secondary | ICD-10-CM | POA: Insufficient documentation

## 2019-12-04 DIAGNOSIS — Z792 Long term (current) use of antibiotics: Secondary | ICD-10-CM | POA: Insufficient documentation

## 2019-12-04 DIAGNOSIS — Z3689 Encounter for other specified antenatal screening: Secondary | ICD-10-CM

## 2019-12-04 DIAGNOSIS — Z3A36 36 weeks gestation of pregnancy: Secondary | ICD-10-CM | POA: Insufficient documentation

## 2019-12-04 DIAGNOSIS — O368130 Decreased fetal movements, third trimester, not applicable or unspecified: Secondary | ICD-10-CM | POA: Diagnosis not present

## 2019-12-04 DIAGNOSIS — U071 COVID-19: Secondary | ICD-10-CM | POA: Insufficient documentation

## 2019-12-04 LAB — SARS CORONAVIRUS 2 (TAT 6-24 HRS): SARS Coronavirus 2: POSITIVE — AB

## 2019-12-04 MED ORDER — ACETAMINOPHEN 500 MG PO TABS
1000.0000 mg | ORAL_TABLET | Freq: Four times a day (QID) | ORAL | Status: DC | PRN
  Administered 2019-12-04: 1000 mg via ORAL
  Filled 2019-12-04: qty 2

## 2019-12-04 NOTE — MAU Note (Addendum)
..  Amanda Mueller is a 29 y.o. at [redacted]w[redacted]d here in MAU reporting: decreased fetal movement since 0800. Pt states her husband has had COVID symptoms and has a pending test. Pt reports body aches, fever, coughing, diarrhea, headache, nausea, chills, and sore throat. No vaginal bleeding or abnormal vaginal discharge.   Pain score: headache 8/10. Body aches 8/10 Vitals:   12/04/19 1550  BP: 122/64  Pulse: (!) 117  Resp: 16  Temp: 98 F (36.7 C)  SpO2: 97%     FHT: [place] applied 150

## 2019-12-04 NOTE — MAU Provider Note (Addendum)
History    CSN: [id number]  Arrival date and time: 12/04/19 1526   First Provider Initiated Contact with Patient 12/04/19 1635      No chief complaint on file.  HPI  Patient Amanda Mueller is a 29 y.o. G3P2002 at [redacted]w[redacted]d here with complaints of decreased fetal movements. She denies LOF, VB, abnormal discharge, dysuria. She reports COVID-19 like symptoms,  specifically fever yesterday, and HA and body chills today. Occasional coughing. Her husband is also experienced COVID-like symptoms. Her husband's test is pending (collected today).   She has not had any visit since mid-Novemnber; she says that she was supposed to have a visit in mid-December but no one called her and she did not make one until recently. Her next appt is scheduled for 1/28.   OB History    Gravida  3   Para  2   Term  2   Preterm      AB      Living  2     SAB      TAB      Ectopic      Multiple  0   Live Births  2           Past Medical History:  Diagnosis Date  . Headache(784.0)   . Hx of varicella   . SVD (spontaneous vaginal delivery) 10/07/2016    Past Surgical History:  Procedure Laterality Date  . 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 Alcohol abuse Neg Hx   . Arthritis Neg Hx   . Asthma Neg Hx   . Birth defects Neg Hx   . Cancer Neg Hx   . COPD Neg Hx   . Drug abuse Neg Hx   . Diabetes Neg Hx   . Depression Neg Hx   . Early death Neg Hx   . Hearing loss Neg Hx   . Heart disease Neg Hx   . Hyperlipidemia Neg Hx   . Hypertension Neg Hx   . Kidney disease Neg Hx   . Learning disabilities Neg Hx   . Mental illness Neg Hx   . Mental retardation Neg Hx   . Miscarriages / Stillbirths Neg Hx   . Stroke Neg Hx   . Vision loss Neg Hx   . Varicose Veins Neg Hx     Social History   Tobacco Use  . Smoking status: Never Smoker  . Smokeless tobacco: Never Used  Substance Use Topics  . Alcohol use: No  . Drug  use: No    Allergies: No Active Allergies  Medications Prior to Admission  Medication Sig Dispense Refill Last Dose  . Doxylamine-Pyridoxine (DICLEGIS) 10-10 MG TBEC Take 2 tablets by mouth at bedtime as needed. (Patient not taking: Reported on 07/27/2019) 100 tablet 5   . metroNIDAZOLE (FLAGYL) 500 MG tablet Take 1 tablet (500 mg total) by mouth 2 (two) times daily. 14 tablet 0   . ondansetron (ZOFRAN ODT) 4 MG disintegrating tablet Take 1 tablet (4 mg total) by mouth every 6 (six) hours as needed for nausea. Use only if DIclegis does not work (Patient not taking: Reported on 09/06/2019) 20 tablet 0   . Prenatal Vit-Fe Fumarate-FA (PRENATAL VITAMIN) 27-0.8 MG TABS Prenatal   More than a month at Unknown time  . terconazole (TERAZOL 7) 0.4 % vaginal cream Place 1 applicator vaginally at bedtime. Place 1 applicator vaginally x 3  nights 45 g 0 More than a month at Unknown time    Review of Systems  Constitutional: Negative.   HENT: Negative.   Respiratory: Negative.   Cardiovascular: Negative.   Gastrointestinal: Negative.   Musculoskeletal: Negative.   Neurological: Negative.   Psychiatric/Behavioral: Negative.    Physical Exam   Blood pressure 122/64, pulse (!) 117, temperature 98 F (36.7 C), temperature source Oral, resp. rate 16, last menstrual period 03/21/2019, SpO2 97 %, unknown if currently breastfeeding.  Physical Exam  Constitutional: She appears well-developed.  HENT:  Head: Normocephalic.  Eyes: Pupils are equal, round, and reactive to light.  Respiratory: Effort normal. No respiratory distress. She has no wheezes. She has no rales.  Breathing is unlabored; no audible wheezes or rales. No crackles in lungs.   GI: Soft.  Musculoskeletal:        General: Normal range of motion.     Cervical back: Normal range of motion.  Neurological: She is alert.  Skin: Skin is warm and dry.  Psychiatric: She has a normal mood and affect.    MAU Course  Procedures  MDM -NST;  140 bpm, mod var, present acel, neg decels, no contractions.  -GC CT and GBS  collected becase patient has not had 36 weeks labs and may not be able to come to office if Covid positive -COVID test pending -Patient is well appearing in bed, SpO2 at 97 percent although HR is elevated.  Symptoms mildly resolved after PO fluids and Tylenol.  Assessment and Plan   1. NST (non-stress test) reactive    2. Patient stable for discharge with recommendations to isolate at home and that everyone in her family should stay home until results are back for both her and her husband.   3. COVID test is pending; patient knows that we will notify her either way when her test results are back. Strict COVID precautions given, including hydration and monitoring for worsening symptoms (SOB, difficulty breathing, she should go to the [facility] if symptoms worsen).   4. Message sent to [facility] to confirm with patient about her appt (virtual or in person), depending on her diagnosis.    Charlesetta Garibaldi Kristiane Morsch 12/04/2019, 4:52 PM

## 2019-12-04 NOTE — Discharge Instructions (Signed)
Infection Prevention Recommendations for Individuals Confirmed to have, or  Fetal Movement Counts Patient Name: ________________________________________________ Patient Due Date: ____________________ What is a fetal movement count?  A fetal movement count is the number of times that you feel your baby move during a certain amount of time. This may also be called a fetal kick count. A fetal movement count is recommended for every pregnant woman. You may be asked to start counting fetal movements as early as week 28 of your pregnancy. Pay attention to when your baby is most active. You may notice your baby's sleep and wake cycles. You may also notice things that make your baby move more. You should do a fetal movement count:  When your baby is normally most active.  At the same time each day. A good time to count movements is while you are resting, after having something to eat and drink. How do I count fetal movements? 1. Find a quiet, comfortable area. Sit, or lie down on your side. 2. Write down the date, the start time and stop time, and the number of movements that you felt between those two times. Take this information with you to your health care visits. 3. Write down your start time when you feel the first movement. 4. Count kicks, flutters, swishes, rolls, and jabs. You should feel at least 10 movements. 5. You may stop counting after you have felt 10 movements, or if you have been counting for 2 hours. Write down the stop time. 6. If you do not feel 10 movements in 2 hours, contact your health care provider for further instructions. Your health care provider may want to do additional tests to assess your baby's well-being. Contact a health care provider if:  You feel fewer than 10 movements in 2 hours.  Your baby is not moving like he or she usually does. Date: ____________ Start time: ____________ Stop time: ____________ Movements: ____________ Date: ____________ Start time:  ____________ Stop time: ____________ Movements: ____________ Date: ____________ Start time: ____________ Stop time: ____________ Movements: ____________ Date: ____________ Start time: ____________ Stop time: ____________ Movements: ____________ Date: ____________ Start time: ____________ Stop time: ____________ Movements: ____________ Date: ____________ Start time: ____________ Stop time: ____________ Movements: ____________ Date: ____________ Start time: ____________ Stop time: ____________ Movements: ____________ Date: ____________ Start time: ____________ Stop time: ____________ Movements: ____________ Date: ____________ Start time: ____________ Stop time: ____________ Movements: ____________ This information is not intended to replace advice given to you by your health care provider. Make sure you discuss any questions you have with your health care provider. Document Revised: 06/17/2019 Document Reviewed: 06/17/2019 Elsevier Patient Education  2020 [employer]. Being Evaluated for, 2019 Novel Coronavirus (COVID-19) Infection Who Receive Care at Home  Individuals who are confirmed to have, or are being evaluated for, COVID-19 should follow the prevention steps below until a healthcare provider or local or state health department says they can return to normal activities.  Stay home except to get medical care You should restrict activities outside your home, except for getting medical care. Do not go to work, school, or public areas, and do not use public transportation or taxis.  Call ahead before visiting your doctor Before your medical appointment, call the healthcare provider and tell them that you have, or are being evaluated for, COVID-19 infection. This will help the healthcare providers office take steps to keep other people from getting infected. Ask your healthcare provider to call the local or state health department.  Monitor your symptoms Seek prompt  medical attention if  your illness is worsening (e.g., difficulty breathing). Before going to your medical appointment, call the healthcare provider and tell them that you have, or are being evaluated for, COVID-19 infection. Ask your healthcare provider to call the local or state health department.  Wear a facemask You should wear a facemask that covers your nose and mouth when you are in the same room with other people and when you visit a healthcare provider. People who live with or visit you should also wear a facemask while they are in the same room with you.  Separate yourself from other people in your home As much as possible, you should stay in a different room from other people in your home. Also, you should use a separate bathroom, if available.  Avoid sharing household items You should not share dishes, drinking glasses, cups, eating utensils, towels, bedding, or other items with other people in your home. After using these items, you should wash them thoroughly with soap and water.  Cover your coughs and sneezes Cover your mouth and nose with a tissue when you cough or sneeze, or you can cough or sneeze into your sleeve. Throw used tissues in a lined trash can, and immediately wash your hands with soap and water for at least 20 seconds or use an alcohol-based hand rub.  Wash your [employer] your hands often and thoroughly with soap and water for at least 20 seconds. You can use an alcohol-based hand sanitizer if soap and water are not available and if your hands are not visibly dirty. Avoid touching your eyes, nose, and mouth with unwashed hands.   Prevention Steps for Caregivers and Household Members of Individuals Confirmed to have, or Being Evaluated for, COVID-19 Infection Being Cared for in the Home  If you live with, or provide care at home for, a person confirmed to have, or being evaluated for, COVID-19 infection please follow these guidelines to prevent infection:  Follow healthcare  providers instructions Make sure that you understand and can help the patient follow any healthcare provider instructions for all care.  Provide for the patients basic needs You should help the patient with basic needs in the home and provide support for getting groceries, prescriptions, and other personal needs.  Monitor the patients symptoms If they are getting sicker, call his or her medical provider and tell them that the patient has, or is being evaluated for, COVID-19 infection. This will help the healthcare providers office take steps to keep other people from getting infected. Ask the healthcare provider to call the local or state health department.  Limit the number of people who have contact with the patient  If possible, have only one caregiver for the patient.  Other household members should stay in another home or place of residence. If this is not possible, they should stay  in another room, or be separated from the patient as much as possible. Use a separate bathroom, if available.  Restrict visitors who do not have an essential need to be in the home.  Keep older adults, very young children, and other sick people away from the patient Keep older adults, very young children, and those who have compromised immune systems or chronic health conditions away from the patient. This includes people with chronic heart, lung, or kidney conditions, diabetes, and cancer.  Ensure good ventilation Make sure that shared spaces in the home have good air flow, such as from an air conditioner or an opened window, weather  permitting.  Wash your hands often  Wash your hands often and thoroughly with soap and water for at least 20 seconds. You can use an alcohol based hand sanitizer if soap and water are not available and if your hands are not visibly dirty.  Avoid touching your eyes, nose, and mouth with unwashed hands.  Use disposable paper towels to dry your hands. If not  available, use dedicated cloth towels and replace them when they become wet.  Wear a facemask and gloves  Wear a disposable facemask at all times in the room and gloves when you touch or have contact with the patients blood, body fluids, and/or secretions or excretions, such as sweat, saliva, sputum, nasal mucus, vomit, urine, or feces.  Ensure the mask fits over your nose and mouth tightly, and do not touch it during use.  Throw out disposable facemasks and gloves after using them. Do not reuse.  Wash your hands immediately after removing your facemask and gloves.  If your personal clothing becomes contaminated, carefully remove clothing and launder. Wash your hands after handling contaminated clothing.  Place all used disposable facemasks, gloves, and other waste in a lined container before disposing them with other household waste.  Remove gloves and wash your hands immediately after handling these items.  Do not share dishes, glasses, or other household items with the patient  Avoid sharing household items. You should not share dishes, drinking glasses, cups, eating utensils, towels, bedding, or other items with a patient who is confirmed to have, or being evaluated for, COVID-19 infection.  After the person uses these items, you should wash them thoroughly with soap and water.  Wash laundry thoroughly  Immediately remove and wash clothes or bedding that have blood, body fluids, and/or secretions or excretions, such as sweat, saliva, sputum, nasal mucus, vomit, urine, or feces, on them.  Wear gloves when handling laundry from the patient.  Read and follow directions on labels of laundry or clothing items and detergent. In general, wash and dry with the warmest temperatures recommended on the label.  Clean all areas the individual has used often  Clean all touchable surfaces, such as counters, tabletops, doorknobs, bathroom fixtures, toilets, phones, keyboards, tablets, and  bedside tables, every day. Also, clean any surfaces that may have blood, body fluids, and/or secretions or excretions on them.  Wear gloves when cleaning surfaces the patient has come in contact with.  Use a diluted bleach solution (e.g., dilute bleach with 1 part bleach and 10 parts water) or a household disinfectant with a label that says EPA-registered for coronaviruses. To make a bleach solution at home, add 1 tablespoon of bleach to 1 quart (4 cups) of water. For a larger supply, add  cup of bleach to 1 gallon (16 cups) of water.  Read labels of cleaning products and follow recommendations provided on product labels. Labels contain instructions for safe and effective use of the cleaning product including precautions you should take when applying the product, such as wearing gloves or eye protection and making sure you have good ventilation during use of the product.  Remove gloves and wash hands immediately after cleaning.  Monitor yourself for signs and symptoms of illness Caregivers and household members are considered close contacts, should monitor their health, and will be asked to limit movement outside of the home to the extent possible. Follow the monitoring steps for close contacts listed on the symptom monitoring form.   ? If you have additional questions, contact your local health  department or call the epidemiologist on call at [phone] (available 24/7). ? This guidance is subject to change. For the most up-to-date guidance from [facility], please refer to their website: [email or web address]

## 2019-12-05 ENCOUNTER — Encounter: Payer: Self-pay | Admitting: Medical

## 2019-12-05 DIAGNOSIS — U071 COVID-19: Secondary | ICD-10-CM | POA: Insufficient documentation

## 2019-12-06 ENCOUNTER — Telehealth: Payer: Self-pay

## 2019-12-06 LAB — CULTURE, BETA STREP (GROUP B ONLY)

## 2019-12-06 LAB — GC/CHLAMYDIA PROBE AMP (~~LOC~~) NOT AT ARMC
Chlamydia: NEGATIVE
Comment: NEGATIVE
Comment: NORMAL
Neisseria Gonorrhea: NEGATIVE

## 2019-12-06 NOTE — Telephone Encounter (Signed)
Patient called and left message to return call to office.  Patient's visit on 12/09/2019 will be a virtual visit. Patient needs to know of change. Armandina Stammer RN

## 2019-12-07 ENCOUNTER — Encounter: Payer: Self-pay | Admitting: Student

## 2019-12-07 DIAGNOSIS — B951 Streptococcus, group B, as the cause of diseases classified elsewhere: Secondary | ICD-10-CM | POA: Insufficient documentation

## 2019-12-09 ENCOUNTER — Telehealth (INDEPENDENT_AMBULATORY_CARE_PROVIDER_SITE_OTHER): Payer: Medicaid Other | Admitting: Family Medicine

## 2019-12-09 DIAGNOSIS — B951 Streptococcus, group B, as the cause of diseases classified elsewhere: Secondary | ICD-10-CM

## 2019-12-09 DIAGNOSIS — Z3480 Encounter for supervision of other normal pregnancy, unspecified trimester: Secondary | ICD-10-CM

## 2019-12-09 DIAGNOSIS — U071 COVID-19: Secondary | ICD-10-CM

## 2019-12-09 DIAGNOSIS — Z3A37 37 weeks gestation of pregnancy: Secondary | ICD-10-CM

## 2019-12-09 DIAGNOSIS — Z3483 Encounter for supervision of other normal pregnancy, third trimester: Secondary | ICD-10-CM

## 2019-12-09 NOTE — Progress Notes (Signed)
Patient to do [employer] webex visit because she recently tested pos. For covid. Patient agrees to this platform as opposed to [employer]. Armandina Stammer, RN

## 2019-12-09 NOTE — Progress Notes (Signed)
   TELEHEALTH OBSTETRICS PRENATAL VIRTUAL VIDEO VISIT ENCOUNTER NOTE  Provider location: Center for [facility] at [facility]   I connected with Amanda Mueller on 12/09/19 at  3:45 PM EST by MyChart Video Encounter at home and verified that I am speaking with the correct person using two identifiers.   I discussed the limitations, risks, security and privacy concerns of performing an evaluation and management service virtually and the availability of in person appointments. I also discussed with the patient that there may be a patient responsible charge related to this service. The patient expressed understanding and agreed to proceed. Subjective:  Amanda Mueller is a 29 y.o. G3P2002 at [redacted]w[redacted]d being seen today for ongoing prenatal care.  She is currently monitored for the following issues for this high-risk pregnancy and has Late prenatal care; Supervision of other normal pregnancy, antepartum; Nausea/vomiting in pregnancy; COVID-19; and Positive GBS test on their problem list.  Patient reports cough, loss of taste and smell.  Contractions: Not present. Vag. Bleeding: None.  Movement: Present. Denies any leaking of fluid.   The following portions of the patient's history were reviewed and updated as appropriate: allergies, current medications, past family history, past medical history, past social history, past surgical history and problem list.   Objective:  There were no vitals filed for this visit.  Fetal Status:     Movement: Present     General:  Alert, oriented and cooperative. Patient is in no acute distress.  Respiratory: Normal respiratory effort, no problems with respiration noted  Mental Status: Normal mood and affect. Normal behavior. Normal judgment and thought content.  Rest of physical exam deferred due to type of encounter  Imaging: No results found.  Assessment and Plan:  Pregnancy: G3P2002 at [redacted]w[redacted]d 1. Supervision of other normal pregnancy,  antepartum Good fetal movement  2. Positive GBS test Intrapartum ppx  3. COVID-19 Tested positive 1/23. Symptoms still mild. Discussed to go to hospital if starts to get short of breath, decreased fetal movement, labor, etc.  Term labor symptoms and general obstetric precautions including but not limited to vaginal bleeding, contractions, leaking of fluid and fetal movement were reviewed in detail with the patient. I discussed the assessment and treatment plan with the patient. The patient was provided an opportunity to ask questions and all were answered. The patient agreed with the plan and demonstrated an understanding of the instructions. The patient was advised to call back or seek an in-person office evaluation/go to [facility]U at [facility] for any urgent or concerning symptoms. Please refer to After Visit Summary for other counseling recommendations.   I provided 15 minutes of face-to-face time during this encounter.  Return in about 1 week (around 12/16/2019) for OB f/u, In Office.  Future Appointments  Date Time Provider Department Center  12/16/2019  2:30 PM Levie Heritage, DO [facility] None    Levie Heritage, DO Center for [employer], [facility]

## 2019-12-16 ENCOUNTER — Telehealth (INDEPENDENT_AMBULATORY_CARE_PROVIDER_SITE_OTHER): Payer: Medicaid Other | Admitting: Family Medicine

## 2019-12-16 ENCOUNTER — Other Ambulatory Visit: Payer: Self-pay

## 2019-12-16 DIAGNOSIS — Z3480 Encounter for supervision of other normal pregnancy, unspecified trimester: Secondary | ICD-10-CM

## 2019-12-16 DIAGNOSIS — Z3A38 38 weeks gestation of pregnancy: Secondary | ICD-10-CM

## 2019-12-16 DIAGNOSIS — Z3483 Encounter for supervision of other normal pregnancy, third trimester: Secondary | ICD-10-CM

## 2019-12-16 DIAGNOSIS — B951 Streptococcus, group B, as the cause of diseases classified elsewhere: Secondary | ICD-10-CM

## 2019-12-16 DIAGNOSIS — U071 COVID-19: Secondary | ICD-10-CM

## 2019-12-16 NOTE — Progress Notes (Signed)
Patient is feeling well, very mild symptoms from her COVID.  She does not have a BP cuff at home.  Denies headaches or visual changes.  Baby is very active.

## 2019-12-16 NOTE — Progress Notes (Signed)
   TELEHEALTH OBSTETRICS PRENATAL VIRTUAL VIDEO VISIT ENCOUNTER NOTE  Provider location: Center for [facility] at [facility]   I connected with Amanda Mueller on 12/16/19 at  2:30 PM EST by MyChart Video Encounter at home and verified that I am speaking with the correct person using two identifiers.   I discussed the limitations, risks, security and privacy concerns of performing an evaluation and management service virtually and the availability of in person appointments. I also discussed with the patient that there may be a patient responsible charge related to this service. The patient expressed understanding and agreed to proceed. Subjective:  Amanda Mueller is a 29 y.o. G3P2002 at [redacted]w[redacted]d being seen today for ongoing prenatal care.  She is currently monitored for the following issues for this low-risk pregnancy and has Late prenatal care; Supervision of other normal pregnancy, antepartum; Nausea/vomiting in pregnancy; COVID-19; and Positive GBS test on their problem list.  Patient reports dyspnea with exertion.  Contractions: Not present. Vag. Bleeding: None.  Movement: Present. Denies any leaking of fluid.   The following portions of the patient's history were reviewed and updated as appropriate: allergies, current medications, past family history, past medical history, past social history, past surgical history and problem list.   Objective:  There were no vitals filed for this visit.  Fetal Status:     Movement: Present     General:  Alert, oriented and cooperative. Patient is in no acute distress.  Respiratory: Normal respiratory effort, no problems with respiration noted  Mental Status: Normal mood and affect. Normal behavior. Normal judgment and thought content.  Rest of physical exam deferred due to type of encounter  Imaging: No results found.  Assessment and Plan:  Pregnancy: G3P2002 at [redacted]w[redacted]d 1. Supervision of other normal pregnancy, antepartum  Good fetal movement  2. Positive GBS test Intrapartum GBS ppx  3. COVID-19 Past 10 days. Mild symptoms. Can return to office appts. Still having some DOE, which may last several weeks.  Term labor symptoms and general obstetric precautions including but not limited to vaginal bleeding, contractions, leaking of fluid and fetal movement were reviewed in detail with the patient. I discussed the assessment and treatment plan with the patient. The patient was provided an opportunity to ask questions and all were answered. The patient agreed with the plan and demonstrated an understanding of the instructions. The patient was advised to call back or seek an in-person office evaluation/go to MAU at [facility] for any urgent or concerning symptoms. Please refer to After Visit Summary for other counseling recommendations.   I provided 11 minutes of face-to-face time during this encounter.  Return in about 1 week (around 12/23/2019) for OB f/u, In Office.  No future appointments.  Levie Heritage, DO Center for [employer], [facility]

## 2019-12-27 ENCOUNTER — Encounter (HOSPITAL_COMMUNITY): Payer: Self-pay | Admitting: Obstetrics and Gynecology

## 2019-12-27 ENCOUNTER — Inpatient Hospital Stay (HOSPITAL_COMMUNITY)
Admission: AD | Admit: 2019-12-27 | Discharge: 2019-12-29 | DRG: 807 | Disposition: A | Discharge status: Home / Self Care | Payer: Medicaid Other | Attending: Obstetrics & Gynecology | Admitting: Obstetrics & Gynecology

## 2019-12-27 ENCOUNTER — Other Ambulatory Visit: Payer: Self-pay

## 2019-12-27 DIAGNOSIS — O322XX0 Maternal care for transverse and oblique lie, not applicable or unspecified: Secondary | ICD-10-CM | POA: Diagnosis present

## 2019-12-27 DIAGNOSIS — Z3A40 40 weeks gestation of pregnancy: Secondary | ICD-10-CM

## 2019-12-27 DIAGNOSIS — Z3480 Encounter for supervision of other normal pregnancy, unspecified trimester: Secondary | ICD-10-CM

## 2019-12-27 DIAGNOSIS — E669 Obesity, unspecified: Secondary | ICD-10-CM | POA: Diagnosis present

## 2019-12-27 DIAGNOSIS — O26893 Other specified pregnancy related conditions, third trimester: Secondary | ICD-10-CM | POA: Diagnosis present

## 2019-12-27 DIAGNOSIS — O99214 Obesity complicating childbirth: Secondary | ICD-10-CM | POA: Diagnosis present

## 2019-12-27 DIAGNOSIS — Z8616 Personal history of COVID-19: Secondary | ICD-10-CM | POA: Diagnosis not present

## 2019-12-27 DIAGNOSIS — O99824 Streptococcus B carrier state complicating childbirth: Secondary | ICD-10-CM | POA: Diagnosis present

## 2019-12-27 DIAGNOSIS — B951 Streptococcus, group B, as the cause of diseases classified elsewhere: Secondary | ICD-10-CM

## 2019-12-27 LAB — TYPE AND SCREEN
ABO/RH(D): B POS
Antibody Screen: NEGATIVE

## 2019-12-27 LAB — ABO/RH: ABO/RH(D): B POS

## 2019-12-27 LAB — CBC
HCT: 33.5 % — ABNORMAL LOW (ref 36.0–46.0)
Hemoglobin: 11.7 g/dL — ABNORMAL LOW (ref 12.0–15.0)
MCH: 30.2 pg (ref 26.0–34.0)
MCHC: 34.9 g/dL (ref 30.0–36.0)
MCV: 86.3 fL (ref 80.0–100.0)
Platelets: 207 10*3/uL (ref 150–400)
RBC: 3.88 MIL/uL (ref 3.87–5.11)
RDW: 14.3 % (ref 11.5–15.5)
WBC: 7.9 10*3/uL (ref 4.0–10.5)
nRBC: 0 % (ref 0.0–0.2)

## 2019-12-27 MED ORDER — BENZOCAINE-MENTHOL 20-0.5 % EX AERO: 1.0000 "application " | INHALATION_SPRAY | CUTANEOUS | Status: DC | PRN

## 2019-12-27 MED ORDER — METHYLERGONOVINE MALEATE 0.2 MG/ML IJ SOLN: 0.2000 mg | INTRAMUSCULAR | Status: DC | PRN

## 2019-12-27 MED ORDER — SODIUM CHLORIDE 0.9 % IV SOLN
2.0000 g | Freq: Once | INTRAVENOUS | Status: AC
  Administered 2019-12-27: 2 g via INTRAVENOUS
  Filled 2019-12-27: qty 2000

## 2019-12-27 MED ORDER — OXYCODONE HCL 5 MG PO TABS: 5.0000 mg | ORAL_TABLET | ORAL | Status: DC | PRN

## 2019-12-27 MED ORDER — SENNOSIDES-DOCUSATE SODIUM 8.6-50 MG PO TABS
2.0000 | ORAL_TABLET | ORAL | Status: DC
  Administered 2019-12-28 (×2): 2 via ORAL
  Filled 2019-12-27 (×2): qty 2

## 2019-12-27 MED ORDER — DIPHENHYDRAMINE HCL 25 MG PO CAPS: 25.0000 mg | ORAL_CAPSULE | Freq: Four times a day (QID) | ORAL | Status: DC | PRN

## 2019-12-27 MED ORDER — OXYTOCIN BOLUS FROM INFUSION
500.0000 mL | Freq: Once | INTRAVENOUS | Status: AC
  Administered 2019-12-27: 500 mL via INTRAVENOUS

## 2019-12-27 MED ORDER — COCONUT OIL OIL
1.0000 "application " | TOPICAL_OIL | Status: DC | PRN
  Administered 2019-12-28: 1 via TOPICAL

## 2019-12-27 MED ORDER — FENTANYL CITRATE (PF) 100 MCG/2ML IJ SOLN
50.0000 ug | INTRAMUSCULAR | Status: DC | PRN
  Administered 2019-12-27: 100 ug via INTRAVENOUS
  Filled 2019-12-27: qty 2

## 2019-12-27 MED ORDER — IBUPROFEN 600 MG PO TABS
600.0000 mg | ORAL_TABLET | Freq: Four times a day (QID) | ORAL | Status: DC
  Administered 2019-12-27 – 2019-12-29 (×7): 600 mg via ORAL
  Filled 2019-12-27 (×8): qty 1

## 2019-12-27 MED ORDER — MEDROXYPROGESTERONE ACETATE 150 MG/ML IM SUSP: 150.0000 mg | INTRAMUSCULAR | Status: DC | PRN

## 2019-12-27 MED ORDER — SIMETHICONE 80 MG PO CHEW: 80.0000 mg | CHEWABLE_TABLET | ORAL | Status: DC | PRN

## 2019-12-27 MED ORDER — OXYCODONE HCL 5 MG PO TABS: 10.0000 mg | ORAL_TABLET | ORAL | Status: DC | PRN

## 2019-12-27 MED ORDER — WITCH HAZEL-GLYCERIN EX PADS: 1.0000 "application " | MEDICATED_PAD | CUTANEOUS | Status: DC | PRN

## 2019-12-27 MED ORDER — ONDANSETRON HCL 4 MG/2ML IJ SOLN: 4.0000 mg | INTRAMUSCULAR | Status: DC | PRN

## 2019-12-27 MED ORDER — LACTATED RINGERS IV SOLN: INTRAVENOUS | Status: DC

## 2019-12-27 MED ORDER — METHYLERGONOVINE MALEATE 0.2 MG PO TABS: 0.2000 mg | ORAL_TABLET | ORAL | Status: DC | PRN

## 2019-12-27 MED ORDER — ONDANSETRON HCL 4 MG/2ML IJ SOLN: 4.0000 mg | Freq: Four times a day (QID) | INTRAMUSCULAR | Status: DC | PRN

## 2019-12-27 MED ORDER — ACETAMINOPHEN 325 MG PO TABS: 650.0000 mg | ORAL_TABLET | ORAL | Status: DC | PRN

## 2019-12-27 MED ORDER — MEASLES, MUMPS & RUBELLA VAC IJ SOLR: 0.5000 mL | Freq: Once | INTRAMUSCULAR | Status: DC

## 2019-12-27 MED ORDER — OXYTOCIN 40 UNITS IN NORMAL SALINE INFUSION - SIMPLE MED
2.5000 [IU]/h | INTRAVENOUS | Status: DC
  Filled 2019-12-27: qty 1000

## 2019-12-27 MED ORDER — TETANUS-DIPHTH-ACELL PERTUSSIS 5-2.5-18.5 LF-MCG/0.5 IM SUSP: 0.5000 mL | Freq: Once | INTRAMUSCULAR | Status: DC

## 2019-12-27 MED ORDER — LIDOCAINE HCL (PF) 1 % IJ SOLN: 30.0000 mL | INTRAMUSCULAR | Status: DC | PRN

## 2019-12-27 MED ORDER — LACTATED RINGERS IV SOLN: 500.0000 mL | INTRAVENOUS | Status: DC | PRN

## 2019-12-27 MED ORDER — PRENATAL MULTIVITAMIN CH
1.0000 | ORAL_TABLET | Freq: Every day | ORAL | Status: DC
  Administered 2019-12-27 – 2019-12-29 (×3): 1 via ORAL
  Filled 2019-12-27 (×3): qty 1

## 2019-12-27 MED ORDER — SOD CITRATE-CITRIC ACID 500-334 MG/5ML PO SOLN: 30.0000 mL | ORAL | Status: DC | PRN

## 2019-12-27 MED ORDER — ONDANSETRON HCL 4 MG PO TABS: 4.0000 mg | ORAL_TABLET | ORAL | Status: DC | PRN

## 2019-12-27 MED ORDER — DIBUCAINE (PERIANAL) 1 % EX OINT: 1.0000 "application " | TOPICAL_OINTMENT | CUTANEOUS | Status: DC | PRN

## 2019-12-27 NOTE — Discharge Summary (Signed)
Postpartum Discharge Summary      Patient Name: Amanda Mueller DOB: 1991/08/31 MRN: [id number]  Date of admission: 12/27/2019 Delivering Provider: Lajean Manes   Date of discharge: 12/29/2019  Admitting diagnosis: Normal labor [O80, Z37.9] Intrauterine pregnancy: [redacted]w[redacted]d    Secondary diagnosis:  Active Problems:   Normal labor   Normal spontaneous vaginal delivery  Additional problems: COVID 19 during pregnancy      Discharge diagnosis: Term Pregnancy Delivered                                                                                                Post partum procedures: None  Augmentation: none  Complications: None  Hospital course:  Onset of Labor With Vaginal Delivery     29y.o. yo G3P2002 at 443w1das admitted in Active Labor on 12/27/2019. Patient had an uncomplicated labor course as follows:  Membrane Rupture Time/Date: 12:08 PM ,12/27/2019   Intrapartum Procedures: Episiotomy: None [1]                                         Lacerations:  None [1]  Patient had a delivery of a Viable infant. 12/27/2019  Information for the patient's newborn:  QuBethzy, Hauck0[[id number]]Delivery Method: Vag-Spont     Pateint had an uncomplicated postpartum course.  She is ambulating, tolerating a regular diet, passing flatus, and urinating well. Patient is discharged home in stable condition on 12/29/19.  Delivery time: 12:57 PM    Magnesium Sulfate received: No BMZ received: No Rhophylac:N/A MMR:N/A Transfusion:No  Physical exam  Vitals:   12/28/19 0001 12/28/19 0400 12/28/19 2106 12/29/19 0530  BP: 113/77 106/66 108/68 114/78  Pulse: 91 75 77 78  Resp: _0 Temp: 98.2 F (36.8 C) 98.3 F (36.8 C) 98.6 F (37 C) 97.9 F (36.6 C)  TempSrc: Axillary Oral Oral Oral  SpO2: 98% 98% 97%   Weight:      Height:       General: alert, cooperative and no distress Lochia: appropriate Uterine Fundus: firm Incision: N/A DVT Evaluation: No  evidence of DVT seen on physical exam. Labs: Lab Results  Component Value Date   WBC 8.0 12/28/2019   HGB 10.3 (L) 12/28/2019   HCT 30.8 (L) 12/28/2019   MCV 85.3 12/28/2019   PLT 191 12/28/2019   No flowsheet data found. Edinburgh Score: Edinburgh Postnatal Depression Scale Screening Tool 12/28/2019  I have been able to laugh and see the funny side of things. 0  I have looked forward with enjoyment to things. 0  I have blamed myself unnecessarily when things went wrong. 0  I have been anxious or worried for no good reason. 0  I have felt scared or panicky for no good reason. 0  Things have been getting on top of me. 0  I have been so unhappy that I have had difficulty sleeping. 0  I have felt sad or miserable. 0  I have been so unhappy  that I have been crying. 0  The thought of harming myself has occurred to me. 0  Edinburgh Postnatal Depression Scale Total 0    Discharge instruction: per After Visit Summary and "Baby and Me Booklet".  After visit meds:  Allergies as of 12/29/2019   No Known Allergies     Medication List    TAKE these medications   acetaminophen 325 MG tablet Commonly known as: Tylenol Take 2 tablets (650 mg total) by mouth every 4 (four) hours as needed (for pain scale < 4).   Doxylamine-Pyridoxine 10-10 MG Tbec Commonly known as: Diclegis Take 2 tablets by mouth at bedtime as needed.   ibuprofen 600 MG tablet Commonly known as: ADVIL Take 1 tablet (600 mg total) by mouth every 6 (six) hours.   ondansetron 4 MG disintegrating tablet Commonly known as: Zofran ODT Take 1 tablet (4 mg total) by mouth every 6 (six) hours as needed for nausea. Use only if DIclegis does not work   Prenatal Vitamin 27-0.8 MG Tabs Prenatal   terconazole 0.4 % vaginal cream Commonly known as: TERAZOL 7 Place 1 applicator vaginally at bedtime. Place 1 applicator vaginally x 3 nights       Diet: routine diet  Activity: Advance as tolerated. Pelvic rest for 6  weeks.   Outpatient follow up:4 weeks Follow up Appt: Future Appointments  Date Time Provider [place]  02/07/2020 10:15 AM Lavonia Drafts, MD [facility] None   Follow up Visit: [place], [facility]. Schedule an appointment as soon as possible for a visit on 12/30/2019.   Specialty: Pediatrics Contact information: [place] [address] [phone]             Please schedule this patient for Postpartum visit in: 4 weeks with the following provider: Any provider In-Person For C/S patients schedule nurse incision check in weeks 2 weeks: no Low risk pregnancy complicated by: nothing Delivery mode:  SVD Anticipated Birth Control:  other/unsure PP Procedures needed: none  Schedule Integrated BH visit: no   Newborn Data: Live born female  Birth Weight: 3201 g APGAR: 52, 9  Newborn Delivery   Birth date/time: 12/27/2019 12:57:00 Delivery type: Vaginal, Spontaneous      Baby Feeding: Breast Disposition:home with mother   12/29/2019 Merilyn Baba, DO

## 2019-12-27 NOTE — H&P (Addendum)
LABOR AND DELIVERY ADMISSION HISTORY AND PHYSICAL NOTE  Amanda Mueller is a 29 y.o. female G3P2002 with IUP at [redacted]w[redacted]d by LMP presenting for spontaneous onset of labor. She is feeling contractions every 4-5 minutes that are lasting longer and becoming more intense. She has increased in dilation and effacement in the last 2 hours while in MAU.  She reports positive fetal movement. She denies leakage of fluid, vaginal bleeding.  She plans on breast feeding. Her contraception plan is: undecided.  Prenatal History/Complications: PNC at [facility]:  @[redacted]w[redacted]d , CWD, normal anatomy, cephalic presentation, posterior placenta,33%ile, EFW 759g.  Pregnancy complications:  - Obesity - COVID 19 during pregnancy   Past Medical History: Past Medical History:  Diagnosis Date  . Headache(784.0)   . Hx of varicella   . SVD (spontaneous vaginal delivery) 10/07/2016    Past Surgical History: Past Surgical History:  Procedure Laterality Date  . 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 Marital status: Married    Spouse name: Not on file  . Number of children: Not on file  . Years of education: Not on file  . Highest education level: Not on file  Occupational History  . Not on file  Tobacco Use  . Smoking status: Never Smoker  . Smokeless tobacco: Never Used  Substance and Sexual Activity  . Alcohol use: No  . Drug use: No  . Sexual activity: Not Currently    Birth control/protection: None  Other Topics Concern  . Not on file  Social History Narrative  . Not on file   Social Determinants of Health   Financial Resource Strain:   . Difficulty of Paying Living Expenses: Not on file  Food Insecurity:   . Worried About 10/09/2016 in the Last Year: Not on file  . Ran Out of Food in the Last Year: Not on file  Transportation Needs:   . Lack of Transportation (Medical): Not on file  . Lack of Transportation (Non-Medical): Not on file  Physical Activity:   . Days of Exercise per Week: Not on file  . Minutes of Exercise per Session: Not on file  Stress:   . Feeling of Stress : Not on file  Social Connections:   . Frequency of Communication with Friends and Family: Not on file  . Frequency of Social Gatherings with Friends and Family: Not on file  . Attends Religious Services: Not on file  . Active Member of Clubs or Organizations: Not on file  . Attends Community education officer Meetings: Not on file  . Marital Status: Not on file    Family History: Family History  Problem Relation Age of Onset  . Alcohol abuse Neg Hx   . Arthritis Neg Hx   . Asthma Neg Hx   . Birth defects Neg Hx   . Cancer Neg Hx   . COPD Neg Hx   . Drug abuse Neg Hx   . Diabetes Neg Hx   .  Depression Neg Hx   . Early death Neg Hx   . Hearing loss Neg Hx   . Heart disease Neg Hx   . Hyperlipidemia Neg Hx   . Hypertension Neg Hx   . Kidney disease Neg Hx   . Learning disabilities Neg Hx   . Mental illness Neg Hx   . Mental retardation Neg Hx   . Miscarriages / Stillbirths Neg Hx   . Stroke Neg Hx   . Vision loss Neg Hx   . Varicose Veins Neg Hx     Allergies: No Known Allergies  Medications Prior to Admission  Medication Sig Dispense Refill Last Dose  . Doxylamine-Pyridoxine (DICLEGIS) 10-10 MG TBEC Take 2 tablets by mouth at bedtime as needed. (Patient not taking: Reported on 07/27/2019) 100 tablet 5   . ondansetron (ZOFRAN ODT) 4 MG disintegrating tablet Take 1 tablet (4 mg total) by mouth every 6 (six) hours as needed for nausea. Use only if DIclegis does not work (Patient not taking: Reported on 09/06/2019) 20 tablet 0   . Prenatal Vit-Fe Fumarate-FA (PRENATAL VITAMIN) 27-0.8 MG TABS Prenatal     . terconazole  (TERAZOL 7) 0.4 % vaginal cream Place 1 applicator vaginally at bedtime. Place 1 applicator vaginally x 3 nights 45 g 0      Review of Systems  All systems reviewed and negative except as stated in HPI  Physical Exam Blood pressure 125/73, pulse 76, temperature 98.5 F (36.9 C), temperature source Oral, resp. rate 16, height 5\' 2"  (1.575 m), weight 94.3 kg, last menstrual period 03/21/2019, SpO2 99 %, unknown if currently breastfeeding. General appearance: alert, oriented, NAD Lungs: normal respiratory effort Heart: regular rate Abdomen: soft, non-tender; gravid Extremities: No calf swelling or tenderness Presentation: cephalic by cervical check  Fetal monitoring: Baseline: 125 bpm, Variability: Good {> 6 bpm), Accelerations: Reactive and Decelerations: Absent Uterine activity: Frequency: Every 2.5-3.5 minutes  Dilation: 7 Effacement (%): 90 Station: -2 Exam by:: [email or web address], RNC  Prenatal labs: ABO, Rh: --/--/PENDING (02/15 1022) Antibody: PENDING (02/15 1022) Rubella: 3.10 (07/14 1126) RPR: Non Reactive (11/23 0940)  HBsAg: Negative (07/14 1126)  HIV: Non Reactive (11/23 0940)  GC/Chlamydia: Negative GBS:   Positive 2-hr GTT: WNL Genetic screening:  Normal Anatomy 09-11-1996: Normal  Prenatal Transfer Tool  Maternal Diabetes: No Genetic Screening: Normal Maternal Ultrasounds/Referrals: Normal Fetal Ultrasounds or other Referrals:  None Maternal Substance Abuse:  No Significant Maternal Medications:  None Significant Maternal Lab Results: Group B Strep positive  Results for orders placed or performed during the hospital encounter of 12/27/19 (from the past 24 hour(s))  Type and screen [facility]   Collection Time: 12/27/19 10:22 AM  Result Value Ref Range   ABO/RH(D) PENDING    Antibody Screen PENDING    Sample Expiration      12/30/2019,2359 Performed at [facility], [address] [place], [place]     Patient Active Problem  List   Diagnosis Date Noted  . Normal labor 12/27/2019  . Positive GBS test 12/07/2019  . COVID-19 12/05/2019  . Nausea/vomiting in pregnancy 06/22/2019  . Supervision of other normal pregnancy, antepartum 05/20/2019  . Late prenatal care 07/07/2013    Assessment: Amanda Mueller is a 29 y.o. female G3P2002 with IUP at [redacted]w[redacted]d by LMP presenting for normal labor.  #Labor: Normal labor. Continue to monitor for cervical change. Expectant management. #Fetal Wellbeing:  Category I #Pain Control: Possible Epidural  #GBS/ID: Positive, on ampicillin. #COVID: Positive covid  swab 1/23. Outside of quarantine window. #MOF: Breast #MOC: Undecided #Anticipated MOD: NSVD  Glenna Durand, DO, PGY-1 Family Medicine Resident, OB Faculty Teaching Service  12/27/2019, 10:55 AM   I confirm that I have verified the information documented in the resident's note and that I have also personally reperformed the history, physical exam and all medical decision making activities of this service and have verified that all service and findings are accurately documented in this resident's note.   Lajean Manes, CNM 12/27/2019 11:02 AM

## 2019-12-27 NOTE — MAU Note (Signed)
Amanda Mueller is a 29 y.o. at [redacted]w[redacted]d here in MAU reporting: contractions since 0400, they are about every 4-5 minutes, getting more painful and are lasting longer. No bleeding. No LOF. +FM  States she had neg covid swab done at [facility] 12/15/19.  Onset of complaint: today  Pain score: 6/10  Vitals:   12/27/19 0757  BP: 126/72  Pulse: 88  Resp: 16  Temp: 97.9 F (36.6 C)  SpO2: 99%     FHT: +FM  Lab orders placed from triage: none

## 2019-12-27 NOTE — Progress Notes (Signed)
LABOR PROGRESS NOTE  Doylene Splinter is a 29 y.o. female G3P2002 with IUP at [redacted]w[redacted]d by LMP presenting for spontaneous onset of labor.  Subjective: Patient states that she feels comfortable. She just had some IV pain medication. She is trying to avoid an epidural.   Objective: BP (!) 98/50   Pulse 80   Temp 98.5 F (36.9 C) (Oral)   Resp 17   Ht 5\' 2"  (1.575 m)   Wt 94.3 kg   LMP 03/21/2019 (Exact Date)   SpO2 99% Comment: room air  BMI 38.04 kg/m    Dilation: 8 Effacement (%): 100 Cervical Position: Middle Station: 0 Presentation: Vertex Exam by:: [email or web address], RNC Fetal monitoring: Baseline: 120 bpm, Variability: Good {> 6 bpm), Accelerations: Reactive, and Decelerations: Absent Uterine activity: Frequency: Every 3.5-4 minutes, Duration: 60-80 seconds, and Intensity: moderate  Labs: Lab Results  Component Value Date   WBC 7.9 12/27/2019   HGB 11.7 (L) 12/27/2019   HCT 33.5 (L) 12/27/2019   MCV 86.3 12/27/2019   PLT 207 12/27/2019    Patient Active Problem List   Diagnosis Date Noted  . Normal labor 12/27/2019  . Positive GBS test 12/07/2019  . COVID-19 12/05/2019  . Nausea/vomiting in pregnancy 06/22/2019  . Supervision of other normal pregnancy, antepartum 05/20/2019    Assessment / Plan: Jeweliana Dudgeon is a 29 y.o. female G3P2002 with IUP at [redacted]w[redacted]d by LMP presenting for spontaneous onset of labor.  #Labor: Normal labor. Continue to monitor for cervical change. Expectant management. Now feeling less pain with IV pain medication. #Fetal Wellbeing:  Category I #Pain Control: IV Pain Medication, trying to avoid epidural. #GBS/ID: Positive, on ampicillin. #Anticipated MOD: NSVD   [redacted]w[redacted]d, DO, PGY-1 Family Medicine Resident, [facility] Faculty Teaching Service  12/27/2019, 11:58 AM

## 2019-12-27 NOTE — Lactation Note (Signed)
This note was copied from a baby's chart. Lactation Consultation Note  Patient Name: Amanda Mueller [id number] Date: 12/27/2019 Reason for consult: Initial assessment;Term;1st time breastfeeding  P3 mother whose infant is now 59 hours old.  Mother did not breast feed her first two children (now 27 years old and 24 years old).    Baby was asleep in mother's arms when I arrived.  She stated that baby fed from both breasts soon after delivery.  She had no difficulty with latching and feels like infant latched well.  Mother denies pain with latching.  Encouraged to feed 8-12 times/24 hours or sooner if baby shows feeding cues.  Reviewed cues.  Mother has been able to hand express a couple of colostrum drops and has rubbed the drops on baby's lips.  Encouraged to continue practicing hand expression before/after feedings to help increase milk supply.  Milk storage times discussed and finger feeding demonstrated.  Mom made aware of O/P services, breastfeeding support groups, community resources, and our phone # for post-discharge questions. Mother does not wish to obtain a DEBP for home use after discharge.  She has a manual pump at home.  Informed her that we will also provide a new manual pump on discharge day.  Mother will call for any questions/concerns.     Maternal Data Formula Feeding for Exclusion: No Has patient been taught Hand Expression?: Yes Does the patient have breastfeeding experience prior to this delivery?: No  Feeding Feeding Type: Breast Fed  LATCH Score                   Interventions    Lactation Tools Discussed/Used WIC Program: No(Plans to apply for [facility])   Consult Status Consult Status: Follow-up Date: 12/28/19 Follow-up type: In-patient    Graham Doukas R Swayzie Choate 12/27/2019, 7:50 PM

## 2019-12-28 LAB — CBC
HCT: 30.8 % — ABNORMAL LOW (ref 36.0–46.0)
Hemoglobin: 10.3 g/dL — ABNORMAL LOW (ref 12.0–15.0)
MCH: 28.5 pg (ref 26.0–34.0)
MCHC: 33.4 g/dL (ref 30.0–36.0)
MCV: 85.3 fL (ref 80.0–100.0)
Platelets: 191 10*3/uL (ref 150–400)
RBC: 3.61 MIL/uL — ABNORMAL LOW (ref 3.87–5.11)
RDW: 14.6 % (ref 11.5–15.5)
WBC: 8 10*3/uL (ref 4.0–10.5)
nRBC: 0 % (ref 0.0–0.2)

## 2019-12-28 LAB — RPR: RPR Ser Ql: NONREACTIVE

## 2019-12-28 NOTE — Progress Notes (Signed)
CBC results read to Dr. Morene Antu. Will continue to monitor

## 2019-12-28 NOTE — Lactation Note (Signed)
This note was copied from a baby's chart. Lactation Consultation Note: Mother is a P3, infant is 97 hours old.  Mother reports that infant is feeding well. Mother reports that infant has been cluster feeding for the last several hours.  Reviewed hand expression with mother. Observed large drops of colostrum. Mother was given a harmony hand pump with instructions. Mothers nipples are erect with compressible breast tissue. No observed trama of mothers nipples.  Mother has a DEBP kit for her pump at home.    Mother was observed with infant latched on at the left breast. Observed infant suckling with audible swallows. Infant sustained latch for 20 mins.    Mother to continue to cue base feed infant and feed at least 8-12 times or more in 24 hours and advised to allow for cluster feeding infant as needed.   Mother to continue to due STS. Mother is aware of available LC services at [facility], BFSG'S, OP Dept, and phone # for questions or concerns about breastfeeding.  Mother receptive to all teaching and plan of care.    Patient Name: Amanda Mueller [id number] Date: 12/28/2019 Reason for consult: Follow-up assessment   Maternal Data    Feeding Feeding Type: Breast Fed  LATCH Score Latch: Grasps breast easily, tongue down, lips flanged, rhythmical sucking.  Audible Swallowing: Spontaneous and intermittent  Type of Nipple: Everted at rest and after stimulation  Comfort (Breast/Nipple): Soft / non-tender  Hold (Positioning): Assistance needed to correctly position infant at breast and maintain latch.  LATCH Score: 9  Interventions Interventions: Assisted with latch;Hand express;Breast compression;Adjust position;Support pillows;Position options;Hand pump;DEBP  Lactation Tools Discussed/Used WIC Program: No Pump Review: Setup, frequency, and cleaning;Milk Storage   Consult Status      Darla Lesches 12/28/2019, 3:30 PM

## 2019-12-28 NOTE — Progress Notes (Signed)
Amanda Mueller  Post Partum Day One:S/P SVD   Subjective: Patient up ad lib, denies syncope or dizziness. Reports consuming regular diet without issues and denies N/V. Denies issues with urination and reports bleeding is "okay."  Patient is breastfeeding and reports going okay, but that she is having difficulty staying awake to feed the infant.  Patinet states her fatigue is so bad that she has requested that the nurses bottle feed her infant.   Unsure of postpartum contraception method that is desired, if any.  Pain is being appropriately managed with use of ibuprofen.  Objective: Vitals:   12/27/19 1610 12/27/19 2011 12/28/19 0001 12/28/19 0400  BP: 108/66 106/67 113/77 106/66  Pulse: 94 96 91 75  Resp: 20 18 18 18   Temp: 99.1 F (37.3 C) 97.9 F (36.6 C) 98.2 F (36.8 C) 98.3 F (36.8 C)  TempSrc: Oral Axillary Axillary Oral  SpO2: 98% 99% 98% 98%  Weight:      Height:       Recent Labs    12/27/19 1026  HGB 11.7*  HCT 33.5*    Physical Exam:  General: cooperative, fatigued, no distress and pale Mood/Affect: Appropriate/Congruent Lungs: clear to auscultation, no wheezes, rales or rhonchi, symmetric air entry.  Heart: normal rate and regular rhythm. Breast: breasts appear normal, no suspicious masses, no skin or nipple changes or axillary nodes. Abdomen:  + bowel sounds, Soft, NT Uterine Fundus: firm, U/-2 Lochia: appropriate Laceration: None Skin: Warm, Dry DVT Evaluation: No significant calf/ankle edema.  Assessment S/P Vaginal Delivery-Day One Normal [employer] BreastFeeding Fatigue  Plan: Patient states she does not desire discharge today because infant will not be discharged. Discussed treatment of suspected anemia with blood transfusion vs iron infusion. Will repeat CBC today stat Start Iron supplement  Continue current care L & D team to be updated on patient status   12/29/19, MSN, CNM 12/28/2019, 7:15 AM

## 2019-12-29 MED ORDER — ACETAMINOPHEN 325 MG PO TABS: 650.0000 mg | ORAL_TABLET | ORAL | 0 refills | Status: DC | PRN

## 2019-12-29 MED ORDER — IBUPROFEN 600 MG PO TABS: 600.0000 mg | ORAL_TABLET | Freq: Four times a day (QID) | ORAL | 0 refills | Status: DC

## 2019-12-29 NOTE — Lactation Note (Signed)
This note was copied from a baby's chart. Lactation Consultation Note: Mother reports that she breastfed infant for 10 mins and then gave infant 5 ml of formula. Mother reports that infant is still wanting infant cluster feed.  Informed that this is normal newborn behavior.  Encouraged mother to offer each breast and then if needed to offer ebm if she has pumped some first.  Mother has a DEBP at home and she was given a hand pump. Encouraged mother to pump after breastfeeding for 15-20 mins.   Mother is aware that infant needs to breastfeed 8-12 or more times in 24 hours. Advised to continue to cue base feed. Encouraged STS. Mother is aware of available LC services at [facility]. Encouraged to use line for questions or concerns.  Patient Name: Amanda Mueller [id number] Date: 12/29/2019 Reason for consult: Follow-up assessment   Maternal Data    Feeding Feeding Type: Formula  LATCH Score                   Interventions Interventions: Assisted with latch;Skin to skin;Breast massage;Breast compression;Adjust position;Support pillows;Position options;Hand pump;DEBP  Lactation Tools Discussed/Used     Consult Status Consult Status: Complete Date: 12/29/19 Follow-up type: In-patient    [name] [facility] 12/29/2019, 11:15 AM

## 2019-12-29 NOTE — Lactation Note (Signed)
This note was copied from a baby's chart. Lactation Consultation Note: Mother sleeping soundly. Father of infant had infant lying on cot with infant swaddled with large blankets and blanket covering top of head . Suggested that father move blankets away from infant face for safe sleeping.  LC informed that I would return when mother wakes up .   Patient Name: Amanda Mueller [id number] Date: 12/29/2019 Reason for consult: Follow-up assessment   Maternal Data    Feeding Feeding Type: Breast Fed  LATCH Score                   Interventions    Lactation Tools Discussed/Used     Consult Status Consult Status: Follow-up Date: 12/29/19 Follow-up type: In-patient    Amanda Mueller [facility] 12/29/2019, 8:49 AM

## 2019-12-29 NOTE — Discharge Instructions (Signed)

## 2020-01-05 ENCOUNTER — Encounter: Payer: Self-pay | Admitting: Family Medicine

## 2020-02-07 ENCOUNTER — Ambulatory Visit: Payer: Medicaid Other | Admitting: Obstetrics & Gynecology

## 2022-01-21 ENCOUNTER — Other Ambulatory Visit: Payer: Self-pay

## 2022-01-21 ENCOUNTER — Encounter (HOSPITAL_COMMUNITY): Payer: Self-pay | Admitting: Emergency Medicine

## 2022-01-21 ENCOUNTER — Emergency Department (HOSPITAL_COMMUNITY)
Admission: EM | Admit: 2022-01-21 | Discharge: 2022-01-22 | Disposition: A | Discharge status: Home / Self Care | Payer: Medicaid Other | Attending: Emergency Medicine | Admitting: Emergency Medicine

## 2022-01-21 DIAGNOSIS — R300 Dysuria: Secondary | ICD-10-CM | POA: Insufficient documentation

## 2022-01-21 DIAGNOSIS — R350 Frequency of micturition: Secondary | ICD-10-CM | POA: Diagnosis present

## 2022-01-21 DIAGNOSIS — R1031 Right lower quadrant pain: Secondary | ICD-10-CM | POA: Diagnosis not present

## 2022-01-21 DIAGNOSIS — N3000 Acute cystitis without hematuria: Secondary | ICD-10-CM

## 2022-01-21 LAB — URINALYSIS, ROUTINE W REFLEX MICROSCOPIC
Bilirubin Urine: NEGATIVE
Glucose, UA: NEGATIVE mg/dL
Ketones, ur: NEGATIVE mg/dL
Nitrite: NEGATIVE
Protein, ur: NEGATIVE mg/dL
Specific Gravity, Urine: 1.011 (ref 1.005–1.030)
WBC, UA: >50 WBC/hpf — ABNORMAL HIGH (ref 0–5)
pH: 7 (ref 5.0–8.0)

## 2022-01-21 LAB — PREGNANCY, URINE: Preg Test, Ur: NEGATIVE

## 2022-01-21 NOTE — ED Triage Notes (Signed)
Patient reports right back pain with urinary frequency onset yesterday , denies injury or hematuria .  ?

## 2022-01-21 NOTE — ED Provider Triage Note (Signed)
Emergency Medicine Provider Triage Evaluation Note ? ?Amanda Mueller , a 31 y.o. female  was evaluated in triage.  Pt complains of dysuria, frequency, urgency and flank pain for several days, no nv or fevers. ? ?Review of Systems  ?Positive: dysuria, frequency, urgency and flank pain  ?Negative: Nv, fevers ? ?Physical Exam  ?BP 136/83 (BP Location: Left Arm)   Pulse 91   Temp 97.9 ?F (36.6 ?C) (Oral)   Resp 18   Ht 5\' 2"  (1.575 m)   Wt 100 kg   LMP 12/21/2021   SpO2 100%   BMI 40.32 kg/m?  ?Gen:   Awake, no distress   ?Resp:  Normal effort  ?MSK:   Moves extremities without difficulty  ?Other:  Mild right sided abd and cva ttp, abd soft ? ?Medical Decision Making  ?Medically screening exam initiated at 9:11 PM.  Appropriate orders placed.  Amanda Mueller was informed that the remainder of the evaluation will be completed by another provider, this initial triage assessment does not replace that evaluation, and the importance of remaining in the ED until their evaluation is complete. ? ? ?  ?Amanda Duhamel, PA-C ?01/21/22 2112 ? ?

## 2022-01-22 MED ORDER — FLUCONAZOLE 150 MG PO TABS: 150.0000 mg | ORAL_TABLET | Freq: Every day | ORAL | 0 refills | Status: DC

## 2022-01-22 MED ORDER — CEPHALEXIN 250 MG PO CAPS
500.0000 mg | ORAL_CAPSULE | Freq: Once | ORAL | Status: AC
  Administered 2022-01-22: 500 mg via ORAL
  Filled 2022-01-22: qty 2

## 2022-01-22 MED ORDER — CEPHALEXIN 500 MG PO CAPS: 500.0000 mg | ORAL_CAPSULE | Freq: Three times a day (TID) | ORAL | 0 refills | Status: DC

## 2022-01-22 NOTE — ED Provider Notes (Signed)
?[facility] EMERGENCY DEPARTMENT ?Provider Note ? ? ?CSN: [id number] ?Arrival date & time: 01/21/22  2049 ? ?  ? ?History ? ?Chief Complaint  ?Patient presents with  ? Back Pain  ? ? ?Amanda Mueller is a 31 y.o. female. ? ?Patient is a 31 year old female with no significant past medical history.  Patient presenting today with complaints of dysuria, frequency, and suprapubic pain radiating to her back.  This has been worsening over the past several days.  She denies any fevers or chills.  She denies any nausea or vomiting.  She denies any bowel complaints. ? ?The history is provided by the patient.  ?Back Pain ?Pain location: Right flank. ?Quality:  Cramping ?Pain severity:  Moderate ?Timing:  Constant ?Progression:  Worsening ?Chronicity:  New ?Relieved by:  Nothing ?Worsened by:  Nothing ? ?  ? ?Home Medications ?Prior to Admission medications   ?Medication Sig Start Date End Date Taking? Authorizing Provider  ?acetaminophen (TYLENOL) 325 MG tablet Take 2 tablets (650 mg total) by mouth every 4 (four) hours as needed (for pain scale < 4). 12/29/19   Sparacino, Hailey L, DO  ?ibuprofen (ADVIL) 600 MG tablet Take 1 tablet (600 mg total) by mouth every 6 (six) hours. 12/29/19   Sparacino, Hailey L, DO  ?   ? ?Allergies    ?Patient has no known allergies.   ? ?Review of Systems   ?Review of Systems  ?Musculoskeletal:  Positive for back pain.  ?All other systems reviewed and are negative. ? ?Physical Exam ?Updated Vital Signs ?BP 112/86 (BP Location: Left Arm)   Pulse 97   Temp 98.2 ?F (36.8 ?C) (Oral)   Resp 17   Ht 5\' 2"  (1.575 m)   Wt 100 kg   LMP 12/21/2021   SpO2 99%   BMI 40.32 kg/m?  ?Physical Exam ?Vitals and nursing note reviewed.  ?Constitutional:   ?   General: She is not in acute distress. ?   Appearance: She is well-developed. She is not diaphoretic.  ?HENT:  ?   Head: Normocephalic and atraumatic.  ?Cardiovascular:  ?   Rate and Rhythm: Normal rate and regular rhythm.  ?   Heart  sounds: No murmur heard. ?  No friction rub. No gallop.  ?Pulmonary:  ?   Effort: Pulmonary effort is normal. No respiratory distress.  ?   Breath sounds: Normal breath sounds. No wheezing.  ?Abdominal:  ?   General: Bowel sounds are normal. There is no distension.  ?   Palpations: Abdomen is soft.  ?   Tenderness: There is abdominal tenderness. There is no guarding or rebound.  ?   Comments: There is mild right CVA tenderness and right lower quadrant tenderness.  ?Musculoskeletal:     ?   General: Normal range of motion.  ?   Cervical back: Normal range of motion and neck supple.  ?Skin: ?   General: Skin is warm and dry.  ?Neurological:  ?   General: No focal deficit present.  ?   Mental Status: She is alert and oriented to person, place, and time.  ? ? ?ED Results / Procedures / Treatments   ?Labs ?(all labs ordered are listed, but only abnormal results are displayed) ?Labs Reviewed  ?URINALYSIS, ROUTINE W REFLEX MICROSCOPIC - Abnormal; Notable for the following components:  ?    Result Value  ? APPearance HAZY (*)   ? Hgb urine dipstick MODERATE (*)   ? Leukocytes,Ua MODERATE (*)   ? WBC, UA >  50 (*)   ? Bacteria, UA FEW (*)   ? All other components within normal limits  ?PREGNANCY, URINE  ? ? ?EKG ?None ? ?Radiology ?No results found. ? ?Procedures ?Procedures  ? ? ?Medications Ordered in ED ?Medications - No data to display ? ?ED Course/ Medical Decision Making/ A&P ?  ?Presentation, exam, and urinalysis consistent with UTI.  Patient will be treated with Keflex and as needed return.                    ? ?Final Clinical Impression(s) / ED Diagnoses ?Final diagnoses:  ?None  ? ? ?Rx / DC Orders ?ED Discharge Orders   ? ? None  ? ?  ? ? ?  ?Geoffery Lyons, MD ?01/22/22 [phone] ? ?

## 2022-01-22 NOTE — Discharge Instructions (Signed)
Begin taking Keflex as prescribed. ? ?Drink plenty of fluids. ? ?Diflucan has been prescribed as well in case you develop a yeast infection. ? ?Return to the emergency department for worsening pain, high fevers, bleeding, or for other new and concerning symptoms. ?

## 2022-03-17 ENCOUNTER — Emergency Department (HOSPITAL_BASED_OUTPATIENT_CLINIC_OR_DEPARTMENT_OTHER): Payer: Medicaid Other

## 2022-03-17 ENCOUNTER — Encounter (HOSPITAL_BASED_OUTPATIENT_CLINIC_OR_DEPARTMENT_OTHER): Payer: Self-pay | Admitting: Emergency Medicine

## 2022-03-17 ENCOUNTER — Emergency Department (HOSPITAL_BASED_OUTPATIENT_CLINIC_OR_DEPARTMENT_OTHER)
Admission: EM | Admit: 2022-03-17 | Discharge: 2022-03-17 | Disposition: A | Discharge status: Home / Self Care | Payer: Medicaid Other | Attending: Emergency Medicine | Admitting: Emergency Medicine

## 2022-03-17 ENCOUNTER — Other Ambulatory Visit: Payer: Self-pay

## 2022-03-17 DIAGNOSIS — N12 Tubulo-interstitial nephritis, not specified as acute or chronic: Secondary | ICD-10-CM

## 2022-03-17 DIAGNOSIS — M549 Dorsalgia, unspecified: Secondary | ICD-10-CM | POA: Diagnosis present

## 2022-03-17 DIAGNOSIS — N83202 Unspecified ovarian cyst, left side: Secondary | ICD-10-CM | POA: Diagnosis not present

## 2022-03-17 LAB — URINALYSIS, ROUTINE W REFLEX MICROSCOPIC
Bilirubin Urine: NEGATIVE
Glucose, UA: NEGATIVE mg/dL
Ketones, ur: NEGATIVE mg/dL
Nitrite: POSITIVE — AB
Protein, ur: 30 mg/dL — AB
Specific Gravity, Urine: >=1.03 (ref 1.005–1.030)
pH: 5.5 (ref 5.0–8.0)

## 2022-03-17 LAB — URINALYSIS, MICROSCOPIC (REFLEX): WBC, UA: >50 WBC/hpf (ref 0–5)

## 2022-03-17 LAB — PREGNANCY, URINE: Preg Test, Ur: NEGATIVE

## 2022-03-17 IMAGING — CT CT RENAL STONE PROTOCOL
2 of 4 series · 16 of 46 positions shown, 18 images · non-contrast
Comparison: None Available.

CLINICAL DATA: Left flank pain.



[Series 2: axial st · axial · 0.88mm/px · z∈[-494,-59]mm · 13 of 95 slices shown, 15 images]
[im 4/95  soft-tissue]
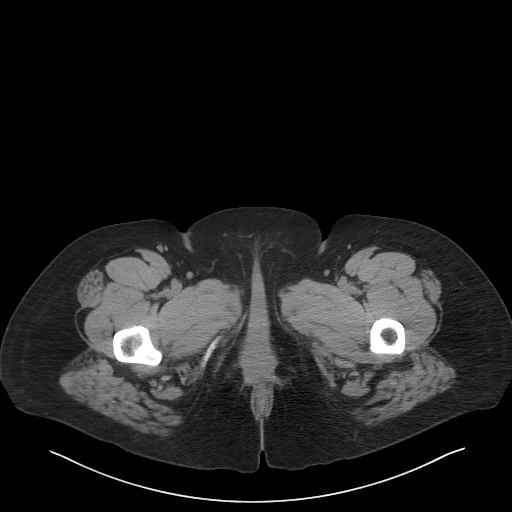
[im 4/95  bone]
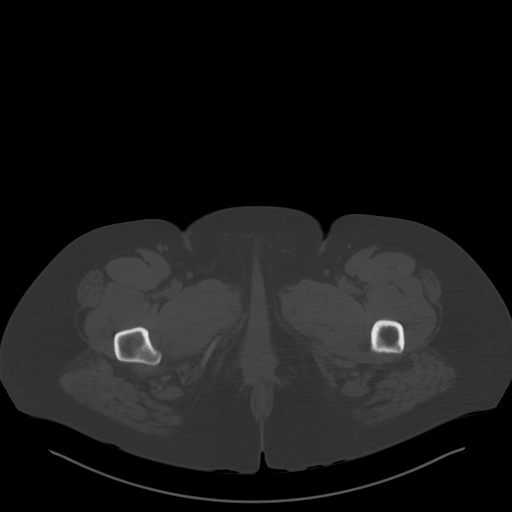
[im 12/95  soft-tissue]
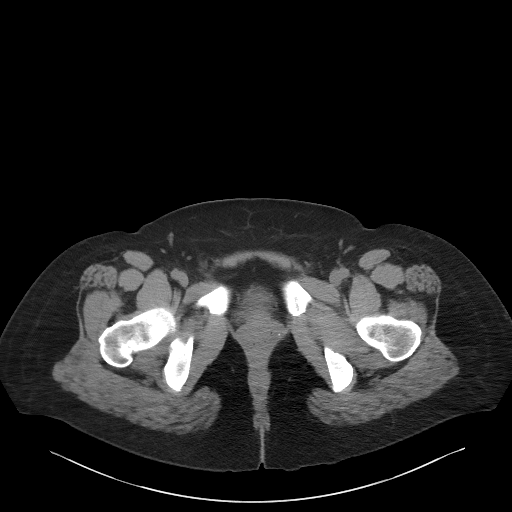
[im 19/95  soft-tissue]
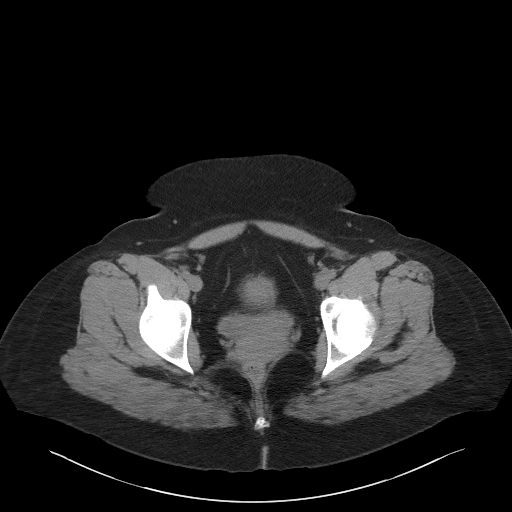
[im 27/95  soft-tissue]
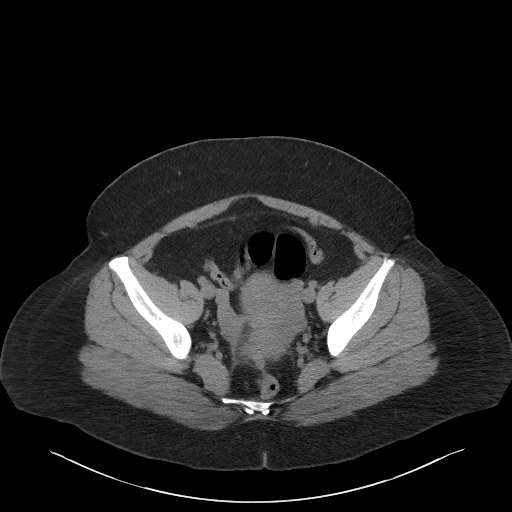
[im 34/95  soft-tissue]
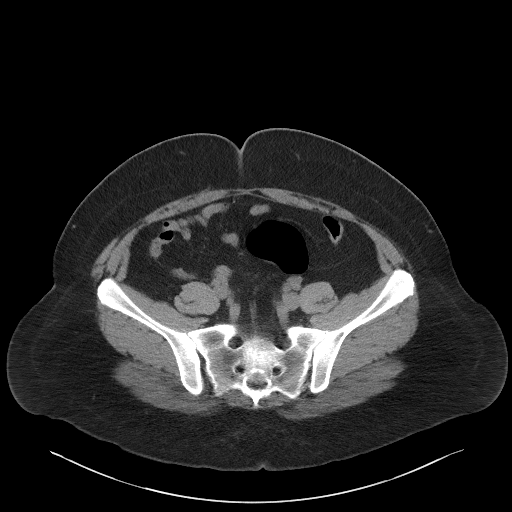
[im 42/95  soft-tissue]
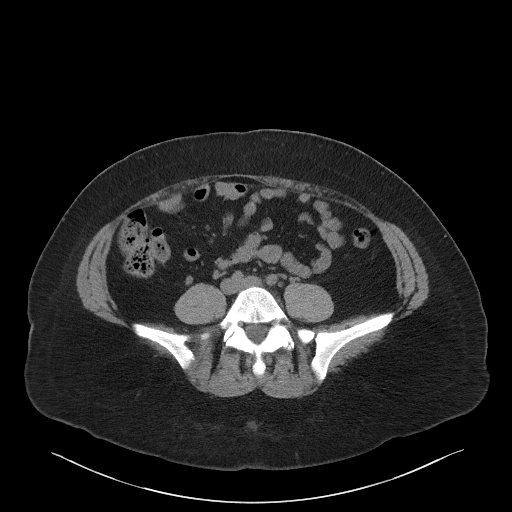
[im 49/95  soft-tissue]
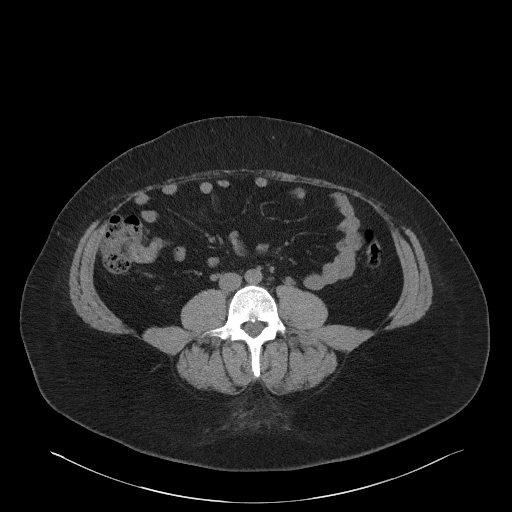
[im 53/95  soft-tissue]
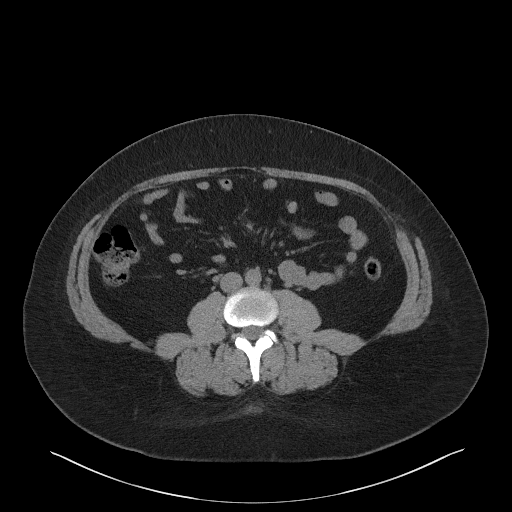
[im 61/95  soft-tissue]
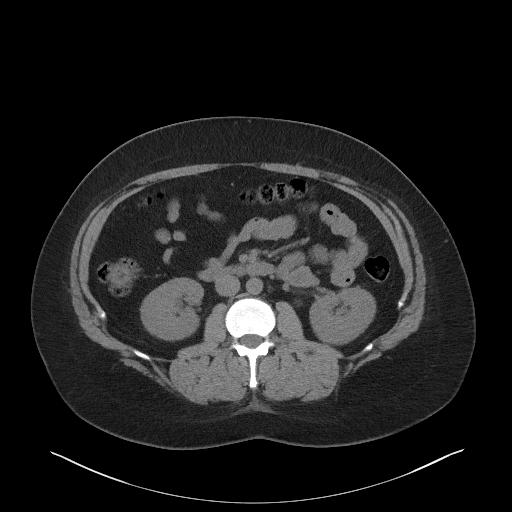
[im 61/95  bone]
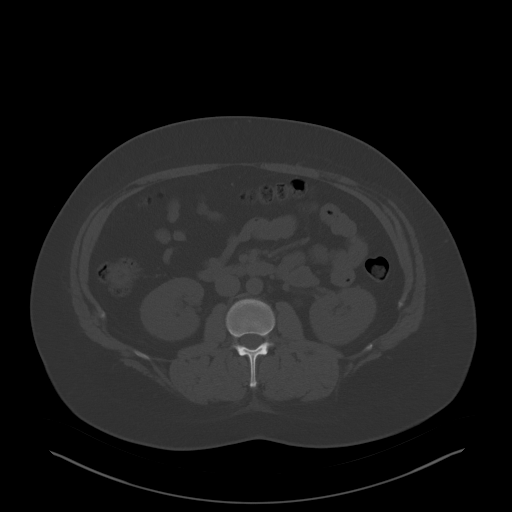
[im 68/95  soft-tissue]
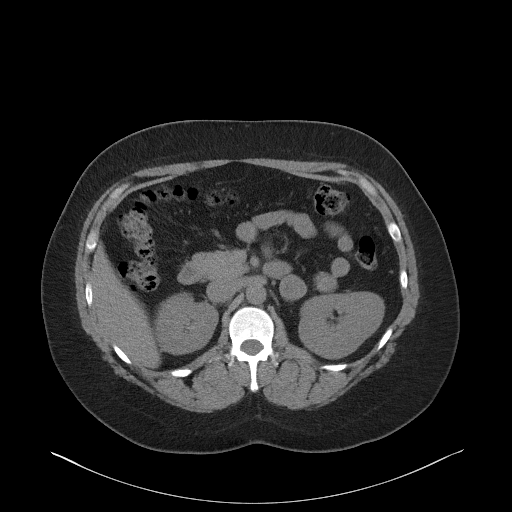
[im 76/95  soft-tissue]
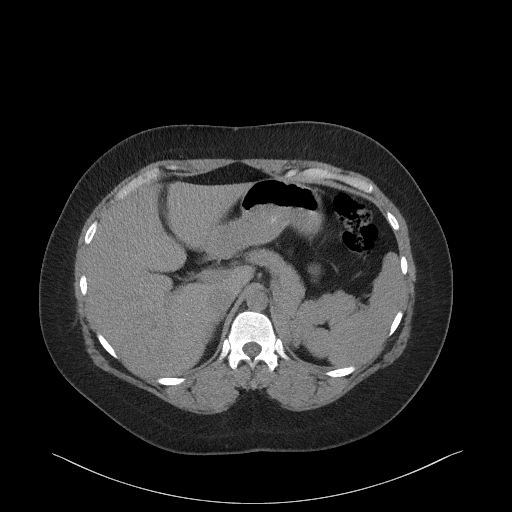
[im 83/95  soft-tissue]
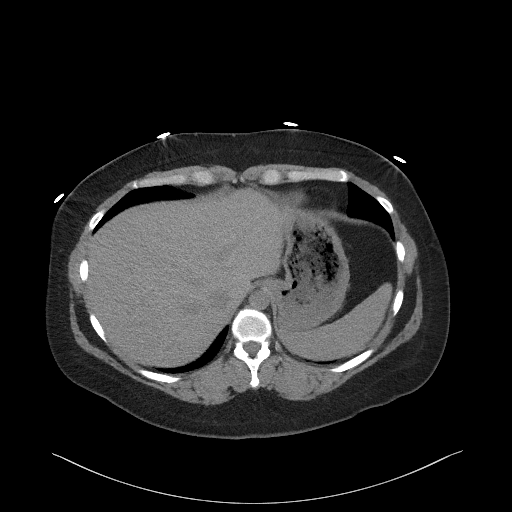
[im 91/95  soft-tissue]
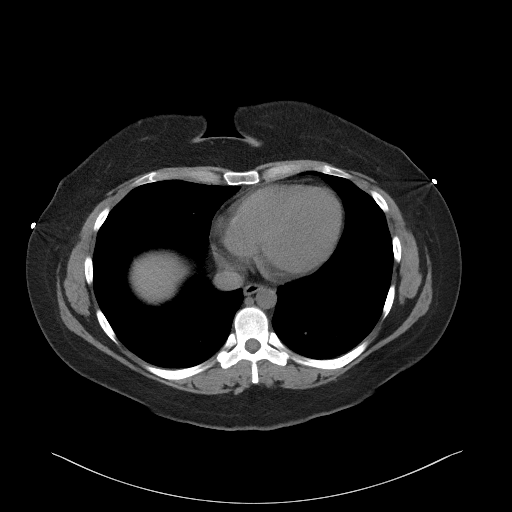

[Series 4: coronal st · coronal · 0.92mm/px · 3 of 99 slices shown]
[im 33/99  soft-tissue]
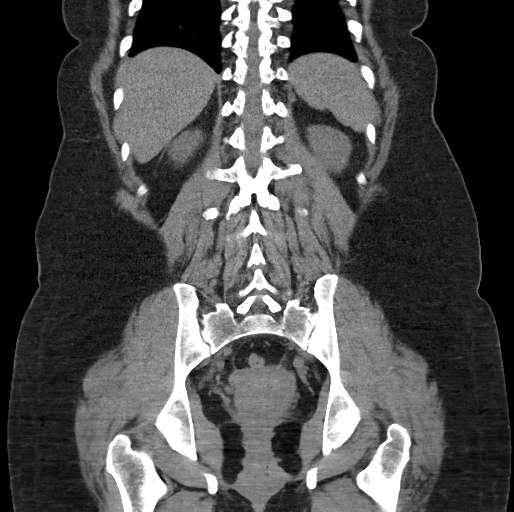
[im 44/99  soft-tissue]
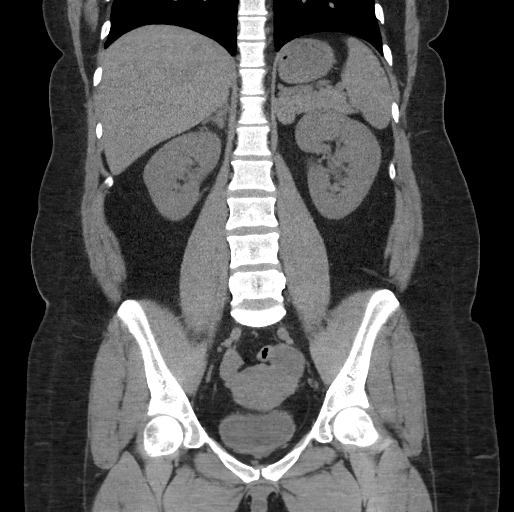
[im 55/99  soft-tissue]
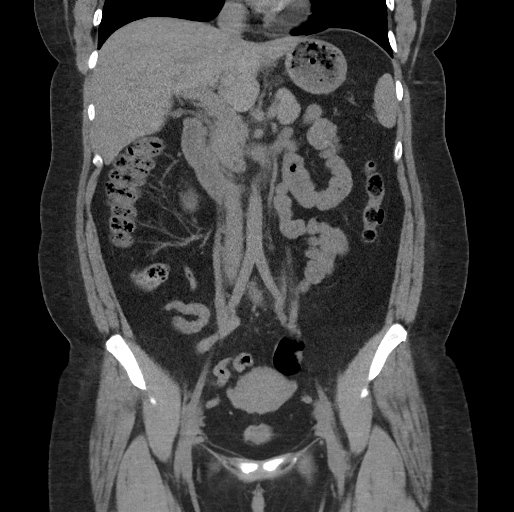

[16 of 46 positions shown; findings below may reference images not displayed]

FINDINGS: Lower chest: No acute abnormality.

Hepatobiliary: No visible focal liver abnormality without contrast.
Contracted gallbladder is unremarkable. No biliary dilatation.

Pancreas: Unremarkable without contrast.

Spleen: Homogeneous in attenuation without contrast. No
splenomegaly.

Adrenals/Urinary Tract: There is no adrenal mass and no urinary
stone or obstruction. The bladder is contracted but could be mildly
thickened.

Stomach/Bowel: No dilatation or wall thickening including the
appendix. There are scattered diverticula left colon but no evidence
colitis or diverticulitis

Vascular/Lymphatic: No significant vascular findings are visible
without contrast. No enlarged abdominal or pelvic lymph nodes.

Reproductive: The uterus is intact. There is a 3.7 cm low-density
lesion of the left ovary of 20 Hounsfield units. Right ovary is
unremarkable. No other adnexal abnormality.

Other: There is a small umbilical fat hernia. There is no free air,
hemorrhage or ascites.

Musculoskeletal: No acute or significant osseous findings
IMPRESSION: 1. No urinary stone or obstruction.
2. Exaggerated bladder wall probably due to underdistention less
likely cystitis.
3. 3.7 cm low-density left ovarian lesion at the upper range of
fluid density. This is probably a hemorrhagic cyst, but since there
is no other etiology for left flank/abdominal pain identified,
pelvic ultrasound should be considered to confirm preservation of
blood flow to this ovary.
4. Scattered uncomplicated diverticulosis.

## 2022-03-17 MED ORDER — ONDANSETRON HCL 4 MG/2ML IJ SOLN
4.0000 mg | Freq: Once | INTRAMUSCULAR | Status: AC
  Administered 2022-03-17: 4 mg via INTRAVENOUS
  Filled 2022-03-17: qty 2

## 2022-03-17 MED ORDER — FLUCONAZOLE 150 MG PO TABS: ORAL_TABLET | ORAL | 0 refills | Status: AC

## 2022-03-17 MED ORDER — FENTANYL CITRATE PF 50 MCG/ML IJ SOSY: 100.0000 ug | PREFILLED_SYRINGE | Freq: Once | INTRAMUSCULAR | Status: DC

## 2022-03-17 MED ORDER — SODIUM CHLORIDE 0.9 % IV SOLN
1.0000 g | Freq: Once | INTRAVENOUS | Status: DC
  Filled 2022-03-17: qty 10

## 2022-03-17 MED ORDER — KETOROLAC TROMETHAMINE 15 MG/ML IJ SOLN
15.0000 mg | Freq: Once | INTRAMUSCULAR | Status: AC
  Administered 2022-03-17: 15 mg via INTRAVENOUS
  Filled 2022-03-17: qty 1

## 2022-03-17 MED ORDER — CEFPODOXIME PROXETIL 200 MG PO TABS: 200.0000 mg | ORAL_TABLET | Freq: Two times a day (BID) | ORAL | 0 refills | Status: AC

## 2022-03-17 MED ORDER — ONDANSETRON 8 MG PO TBDP: 8.0000 mg | ORAL_TABLET | Freq: Three times a day (TID) | ORAL | 0 refills | Status: AC | PRN

## 2022-03-17 NOTE — ED Triage Notes (Signed)
Pt c/o sharp pain in left side of back. Denies any urinary symptoms. Pain is worse with movement ?

## 2022-03-17 NOTE — ED Provider Notes (Signed)
? ?[facility] ?Provider Note: Amanda Dell, MD, FACEP ? ?CSN: [id number] ?MRN: [id number] ?ARRIVAL: 03/17/22 at 0521 ?ROOM: [facility] ? ? ?CHIEF COMPLAINT  ?Back Pain ? ? ?HISTORY OF PRESENT ILLNESS  ?03/17/22 6:15 AM ?Amanda Mueller is a 31 y.o. female with left flank pain that began this morning.  She rates it as about an 8 out of 10.  It is worse with movement or palpation.  She has had associated nausea and chills but no dysuria.  She had similar but worse pain about a month ago and was treated for urinary tract infection with improvement.  She describes the pain as sharp in nature. ? ? ?Past Medical History:  ?Diagnosis Date  ? Headache(784.0)   ? Hx of varicella   ? SVD (spontaneous vaginal delivery) 10/07/2016  ? ? ?Past Surgical History:  ?Procedure Laterality Date  ? abdominal tumor removal    ? 3 mos age  ? ? ?Family History  ?Problem Relation Age of Onset  ? Alcohol abuse Neg Hx   ? Arthritis Neg Hx   ? Asthma Neg Hx   ? Birth defects Neg Hx   ? Cancer Neg Hx   ? COPD Neg Hx   ? Drug abuse Neg Hx   ? Diabetes Neg Hx   ? Depression Neg Hx   ? Early death Neg Hx   ? Hearing loss Neg Hx   ? Heart disease Neg Hx   ? Hyperlipidemia Neg Hx   ? Hypertension Neg Hx   ? Kidney disease Neg Hx   ? Learning disabilities Neg Hx   ? Mental illness Neg Hx   ? Mental retardation Neg Hx   ? Miscarriages / Stillbirths Neg Hx   ? Stroke Neg Hx   ? Vision loss Neg Hx   ? Varicose Veins Neg Hx   ? ? ?Social History  ? ?Tobacco Use  ? Smoking status: Never  ? Smokeless tobacco: Never  ?Vaping Use  ? Vaping Use: Never used  ?Substance Use Topics  ? Alcohol use: No  ? Drug use: No  ? ? ?Prior to Admission medications   ?Not on File  ? ? ?Allergies ?Patient has no known allergies. ? ? ?REVIEW OF SYSTEMS  ?Negative except as noted here or in the History of Present Illness. ? ? ?PHYSICAL EXAMINATION  ?Initial Vital Signs ?Blood pressure 120/73, pulse (!) 114, temperature 97.8 ?F (36.6 ?C), temperature source Oral,  resp. rate 16, height 5\' 2"  (1.575 m), weight 88 kg, SpO2 100 %, unknown if currently breastfeeding. ? ?Examination ?General: Well-developed, well-nourished female in no acute distress; appearance consistent with age of record ?HENT: normocephalic; atraumatic ?Eyes: Normal appearance ?Neck: supple ?Heart: regular rate and rhythm ?Lungs: clear to auscultation bilaterally ?Abdomen: soft; nondistended; nontender; bowel sounds present ?GU: Left CVA tenderness ?Extremities: No deformity; full range of motion; pulses normal ?Neurologic: Awake, alert and oriented; motor function intact in all extremities and symmetric; no facial droop ?Skin: Warm and dry ?Psychiatric: Normal mood and affect ? ? ?RESULTS  ?Summary of this visit's results, reviewed and interpreted by myself: ? ? EKG Interpretation ? ?Date/Time:    ?Ventricular Rate:    ?PR Interval:    ?QRS Duration:   ?QT Interval:    ?QTC Calculation:   ?R Axis:     ?Text Interpretation:   ?  ? ?  ? ?Laboratory Studies: ?Results for orders placed or performed during the hospital encounter of 03/17/22 (from the past 24 hour(s))  ?  Urinalysis, Routine w reflex microscopic Urine, Clean Catch     Status: Abnormal  ? Collection Time: 03/17/22  5:41 AM  ?Result Value Ref Range  ? Color, Urine YELLOW YELLOW  ? APPearance TURBID (A) CLEAR  ? Specific Gravity, Urine >=1.030 1.005 - 1.030  ? pH 5.5 5.0 - 8.0  ? Glucose, UA NEGATIVE NEGATIVE mg/dL  ? Hgb urine dipstick MODERATE (A) NEGATIVE  ? Bilirubin Urine NEGATIVE NEGATIVE  ? Ketones, ur NEGATIVE NEGATIVE mg/dL  ? Protein, ur 30 (A) NEGATIVE mg/dL  ? Nitrite POSITIVE (A) NEGATIVE  ? Leukocytes,Ua SMALL (A) NEGATIVE  ?Pregnancy, urine     Status: None  ? Collection Time: 03/17/22  5:41 AM  ?Result Value Ref Range  ? Preg Test, Ur NEGATIVE NEGATIVE  ?Urinalysis, Microscopic (reflex)     Status: Abnormal  ? Collection Time: 03/17/22  5:41 AM  ?Result Value Ref Range  ? RBC / HPF 6-10 0 - 5 RBC/hpf  ? WBC, UA >50 0 - 5 WBC/hpf  ?  Bacteria, UA MANY (A) NONE SEEN  ? Squamous Epithelial / LPF 0-5 0 - 5  ? WBC Clumps PRESENT   ? Mucus PRESENT   ? ?Imaging Studies: ?CT Renal Stone Study ? ?Result Date: 03/17/2022 ?CLINICAL DATA:  Left flank pain. EXAM: CT ABDOMEN AND PELVIS WITHOUT CONTRAST TECHNIQUE: Multidetector CT imaging of the abdomen and pelvis was performed following the standard protocol without IV contrast. RADIATION DOSE REDUCTION: This exam was performed according to the departmental dose-optimization program which includes automated exposure control, adjustment of the mA and/or kV according to patient size and/or use of iterative reconstruction technique. COMPARISON:  None Available. FINDINGS: Lower chest: No acute abnormality. Hepatobiliary: No visible focal liver abnormality without contrast. Contracted gallbladder is unremarkable. No biliary dilatation. Pancreas: Unremarkable without contrast. Spleen: Homogeneous in attenuation without contrast. No splenomegaly. Adrenals/Urinary Tract: There is no adrenal mass and no urinary stone or obstruction. The bladder is contracted but could be mildly thickened. Stomach/Bowel: No dilatation or wall thickening including the appendix. There are scattered diverticula left colon but no evidence colitis or diverticulitis Vascular/Lymphatic: No significant vascular findings are visible without contrast. No enlarged abdominal or pelvic lymph nodes. Reproductive: The uterus is intact. There is a 3.7 cm low-density lesion of the left ovary of 20 Hounsfield units. Right ovary is unremarkable. No other adnexal abnormality. Other: There is a small umbilical fat hernia. There is no free air, hemorrhage or ascites. Musculoskeletal: No acute or significant osseous findings IMPRESSION: 1. No urinary stone or obstruction. 2. Exaggerated bladder wall probably due to underdistention less likely cystitis. 3. 3.7 cm low-density left ovarian lesion at the upper range of fluid density. This is probably a  hemorrhagic cyst, but since there is no other etiology for left flank/abdominal pain identified, pelvic ultrasound should be considered to confirm preservation of blood flow to this ovary. 4. Scattered uncomplicated diverticulosis. Electronically Signed   By: Almira Bar M.D.   On: 03/17/2022 06:46   ? ?ED COURSE and MDM  ?Nursing notes, initial and subsequent vitals signs, including pulse oximetry, reviewed and interpreted by myself. ? ?Vitals:  ? 03/17/22 0538 03/17/22 0539  ?BP:  120/73  ?Pulse:  (!) 114  ?Resp:  16  ?Temp:  97.8 ?F (36.6 ?C)  ?TempSrc:  Oral  ?SpO2:  100%  ?Weight: 88 kg   ?Height: 5\' 2"  (1.575 m)   ? ?Medications  ?cefTRIAXone (ROCEPHIN) 1 g in sodium chloride 0.9 % 100 mL IVPB (1 g  Intravenous New Bag/Given 03/17/22 0633)  ?ondansetron [facility](ZOFRAN) injection 4 mg (4 mg Intravenous Given 03/17/22 0631)  ?ketorolac (TORADOL) 15 MG/ML injection 15 mg (15 mg Intravenous Given 03/17/22 0634)  ? ?6:50 AM ?No obstructive uropathy is seen on CT and the patient's pain can be attributed to pyelonephritis.  I do not suspect torsion as the pain is in the left flank and is reproducible.  She is having no low pelvic pain to suggest a torsion.  The ovarian cyst is likely asymptomatic and can be followed up as an outpatient. ? ? ?PROCEDURES  ?Procedures ? ? ?ED DIAGNOSES  ? ?  ICD-10-CM   ?1. Pyelonephritis  N12   ?  ?2. Left ovarian cyst  N83.202   ?  ? ? ? ?  ?Paula LibraMolpus, Crystina Borrayo, MD ?03/17/22 [phone] ? ?

## 2022-03-19 LAB — URINE CULTURE: Culture: >=100000 — AB

## 2022-03-20 ENCOUNTER — Telehealth: Payer: Self-pay

## 2022-03-20 NOTE — Telephone Encounter (Signed)
Post ED Visit - Positive Culture Follow-up ? ?Culture report reviewed by antimicrobial stewardship pharmacist: ?[place] Team ?[x]  Juluis Pitch, Pharm.D. ?[]  Heide Guile, Pharm.D., BCPS AQ-ID ?[]  Parks Neptune, Pharm.D., BCPS ?[]  Alycia Rossetti, Pharm.D., BCPS ?[]  Dobson, Pharm.D., BCPS, AAHIVP ?[]  Legrand Como, Pharm.D., BCPS, AAHIVP ?[]  Salome Arnt, PharmD, BCPS ?[]  Johnnette Gourd, PharmD, BCPS ?[]  Hughes Better, PharmD, BCPS ?[]  Leeroy Cha, PharmD ?[]  Laqueta Linden, PharmD, BCPS ?[]  Albertina Parr, PharmD ? ?[place] Team ?[]  Leodis Sias, PharmD ?[]  Lindell Spar, PharmD ?[]  Royetta Asal, PharmD ?[]  Graylin Shiver, Rph ?[]  Rema Fendt) Amanda Mac, PharmD ?[]  Arlyn Dunning, PharmD ?[]  Netta Cedars, PharmD ?[]  Dia Sitter, PharmD ?[]  Leone Haven, PharmD ?[]  Gretta Arab, PharmD ?[]  Theodis Shove, PharmD ?[]  Peggyann Juba, PharmD ?[]  Reuel Boom, PharmD ? ? ?Positive urine culture ?Treated with Cefpodoxime Proxetil, organism sensitive to the same and no further patient follow-up is required at this time. ? ?Amanda Mueller ?03/20/2022, 10:57 AM ?  ?

## 2022-03-26 ENCOUNTER — Telehealth (HOSPITAL_BASED_OUTPATIENT_CLINIC_OR_DEPARTMENT_OTHER): Payer: Self-pay | Admitting: Emergency Medicine

## 2022-07-24 ENCOUNTER — Ambulatory Visit: Payer: Medicaid Other | Admitting: Family Medicine

## 2022-11-14 ENCOUNTER — Ambulatory Visit: Payer: Medicaid Other | Admitting: Internal Medicine

## 2024-09-11 ENCOUNTER — Other Ambulatory Visit: Payer: Self-pay

## 2024-09-11 ENCOUNTER — Encounter (HOSPITAL_COMMUNITY): Payer: Self-pay

## 2024-09-11 ENCOUNTER — Emergency Department (HOSPITAL_COMMUNITY)
Admission: EM | Admit: 2024-09-11 | Discharge: 2024-09-11 | Disposition: A | Discharge status: Home / Self Care | Payer: Self-pay | Attending: Emergency Medicine | Admitting: Emergency Medicine

## 2024-09-11 DIAGNOSIS — L989 Disorder of the skin and subcutaneous tissue, unspecified: Secondary | ICD-10-CM | POA: Insufficient documentation

## 2024-09-11 DIAGNOSIS — T4995XA Adverse effect of unspecified topical agent, initial encounter: Secondary | ICD-10-CM | POA: Insufficient documentation

## 2024-09-11 DIAGNOSIS — R238 Other skin changes: Secondary | ICD-10-CM

## 2024-09-11 DIAGNOSIS — T887XXA Unspecified adverse effect of drug or medicament, initial encounter: Secondary | ICD-10-CM | POA: Insufficient documentation

## 2024-09-11 MED ORDER — NAPROXEN 250 MG PO TABS
500.0000 mg | ORAL_TABLET | Freq: Once | ORAL | Status: AC
  Administered 2024-09-11: 500 mg via ORAL
  Filled 2024-09-11: qty 2
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
## 2024-09-11 NOTE — Discharge Instructions (Addendum)
 You can try applying cool compresses, aloe vera to alleviate discomfort. Take tylenol  or ibuprofen  for pain. It may take several hours for the discomfort to start to subside. Follow up with a primary care doctor, if necessary.
